# Patient Record
Sex: Male | Born: 1945 | Race: White | Hispanic: No | Marital: Married | Smoking: Never smoker
Health system: Southern US, Community
[De-identification: ages and names within clinical notes are randomized; demographics above are authoritative.]

## PROBLEM LIST (undated history)

## (undated) DIAGNOSIS — M1990 Unspecified osteoarthritis, unspecified site: Secondary | ICD-10-CM

## (undated) DIAGNOSIS — I499 Cardiac arrhythmia, unspecified: Secondary | ICD-10-CM

## (undated) DIAGNOSIS — F32A Depression, unspecified: Secondary | ICD-10-CM

## (undated) DIAGNOSIS — I493 Ventricular premature depolarization: Secondary | ICD-10-CM

## (undated) DIAGNOSIS — F329 Major depressive disorder, single episode, unspecified: Secondary | ICD-10-CM

## (undated) DIAGNOSIS — G510 Bell's palsy: Secondary | ICD-10-CM

## (undated) DIAGNOSIS — Z9889 Other specified postprocedural states: Secondary | ICD-10-CM

## (undated) DIAGNOSIS — R112 Nausea with vomiting, unspecified: Secondary | ICD-10-CM

## (undated) DIAGNOSIS — F419 Anxiety disorder, unspecified: Secondary | ICD-10-CM

## (undated) DIAGNOSIS — E1340 Other specified diabetes mellitus with diabetic neuropathy, unspecified: Secondary | ICD-10-CM

## (undated) DIAGNOSIS — J189 Pneumonia, unspecified organism: Secondary | ICD-10-CM

## (undated) DIAGNOSIS — E785 Hyperlipidemia, unspecified: Secondary | ICD-10-CM

## (undated) DIAGNOSIS — I10 Essential (primary) hypertension: Secondary | ICD-10-CM

## (undated) DIAGNOSIS — E119 Type 2 diabetes mellitus without complications: Secondary | ICD-10-CM

## (undated) DIAGNOSIS — Z87442 Personal history of urinary calculi: Secondary | ICD-10-CM

## (undated) DIAGNOSIS — R110 Nausea: Secondary | ICD-10-CM

## (undated) DIAGNOSIS — K219 Gastro-esophageal reflux disease without esophagitis: Secondary | ICD-10-CM

## (undated) DIAGNOSIS — I509 Heart failure, unspecified: Secondary | ICD-10-CM

## (undated) DIAGNOSIS — C801 Malignant (primary) neoplasm, unspecified: Secondary | ICD-10-CM

## (undated) HISTORY — DX: Ventricular premature depolarization: I49.3

## (undated) HISTORY — PX: EYE SURGERY: SHX253

## (undated) HISTORY — PX: CARDIAC CATHETERIZATION: SHX172

## (undated) HISTORY — PX: TONSILLECTOMY: SUR1361

## (undated) HISTORY — PX: BACK SURGERY: SHX140

## (undated) HISTORY — PX: CHOLECYSTECTOMY: SHX55

## (undated) HISTORY — DX: Hyperlipidemia, unspecified: E78.5

## (undated) HISTORY — PX: PROSTATECTOMY: SHX69

---

## 2002-12-19 ENCOUNTER — Encounter: Admission: RE | Admit: 2002-12-19 | Discharge: 2002-12-19 | Payer: Self-pay | Admitting: *Deleted

## 2002-12-19 ENCOUNTER — Encounter: Payer: Self-pay | Admitting: *Deleted

## 2004-03-14 ENCOUNTER — Observation Stay (HOSPITAL_COMMUNITY): Admission: RE | Admit: 2004-03-14 | Discharge: 2004-03-15 | Payer: Self-pay | Admitting: Urology

## 2006-10-16 ENCOUNTER — Encounter: Admission: RE | Admit: 2006-10-16 | Discharge: 2006-10-16 | Payer: Self-pay | Admitting: Unknown Physician Specialty

## 2006-10-21 ENCOUNTER — Encounter: Admission: RE | Admit: 2006-10-21 | Discharge: 2006-10-21 | Payer: Self-pay | Admitting: Unknown Physician Specialty

## 2006-11-25 ENCOUNTER — Encounter: Admission: RE | Admit: 2006-11-25 | Discharge: 2006-11-25 | Payer: Self-pay | Admitting: Neurological Surgery

## 2006-12-09 ENCOUNTER — Inpatient Hospital Stay (HOSPITAL_COMMUNITY): Admission: RE | Admit: 2006-12-09 | Discharge: 2006-12-14 | Payer: Self-pay | Admitting: Neurological Surgery

## 2007-01-04 ENCOUNTER — Encounter: Admission: RE | Admit: 2007-01-04 | Discharge: 2007-01-04 | Payer: Self-pay | Admitting: Neurological Surgery

## 2007-03-07 ENCOUNTER — Encounter: Admission: RE | Admit: 2007-03-07 | Discharge: 2007-03-07 | Payer: Self-pay | Admitting: Neurological Surgery

## 2007-05-31 ENCOUNTER — Encounter: Admission: RE | Admit: 2007-05-31 | Discharge: 2007-05-31 | Payer: Self-pay | Admitting: Neurological Surgery

## 2007-12-12 ENCOUNTER — Encounter: Admission: RE | Admit: 2007-12-12 | Discharge: 2007-12-12 | Payer: Self-pay | Admitting: Anesthesiology

## 2008-05-04 DIAGNOSIS — C801 Malignant (primary) neoplasm, unspecified: Secondary | ICD-10-CM

## 2008-05-04 HISTORY — DX: Malignant (primary) neoplasm, unspecified: C80.1

## 2008-11-08 IMAGING — CT CT L SPINE W/ CM
4 of 10 series · 12 of 33 positions shown, 14 images · IV contrast (omnipaque)
Comparison: MR lumbar spine, 10/16/06.

CLINICAL DATA: Severe back and right leg pain.  
LUMBAR MYELOGRAM:
TECHNIQUE: Following informed consent, sterile preparation of the back, and adequate local anesthesia, a lumbar puncture was performed using a 22 gauge spinal needle at L3-4 from a left paramedian approach.  Fluid was clear and colorless.  15 cc of Omnipaque 180 was instilled in the subarachnoid space.
TECHNIQUE: Multidetector CT imaging of the lumbar spine was performed after intrathecal injection of contrast.  Multiplanar CT image reconstructions were also generated.

[Series 2: l-spine helical · axial · 0.27mm/px · z∈[-4,+76]mm · 2 of 97 slices shown, 3 images]
[im 33/97  soft-tissue]
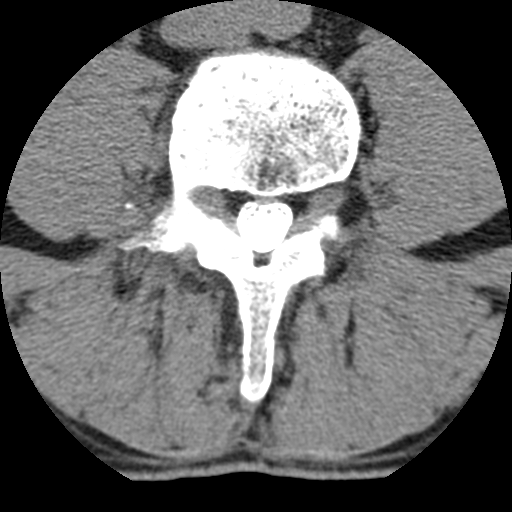
[im 33/97  bone]
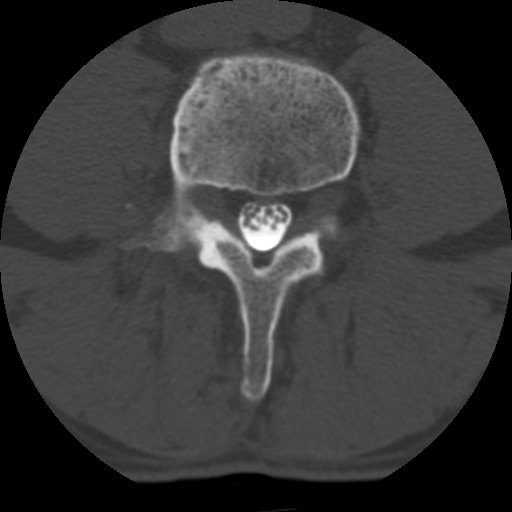
[im 65/97  bone]
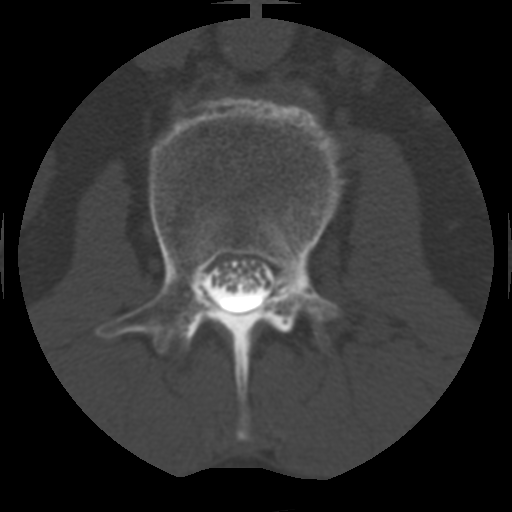

[Series 3: bone windows · axial · 0.27mm/px · z∈[-4,+76]mm · 2 of 97 slices shown]
[im 33/97  bone]
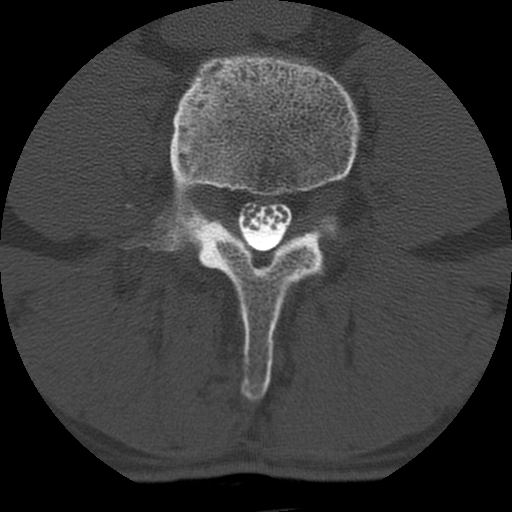
[im 65/97  bone]
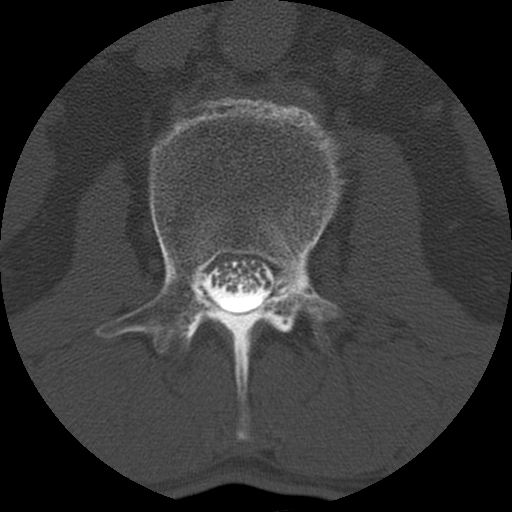

[Series 400: sagittal · sagittal · 0.48mm/px · 5 of 40 slices shown, 6 images]
[im 14/40  bone]
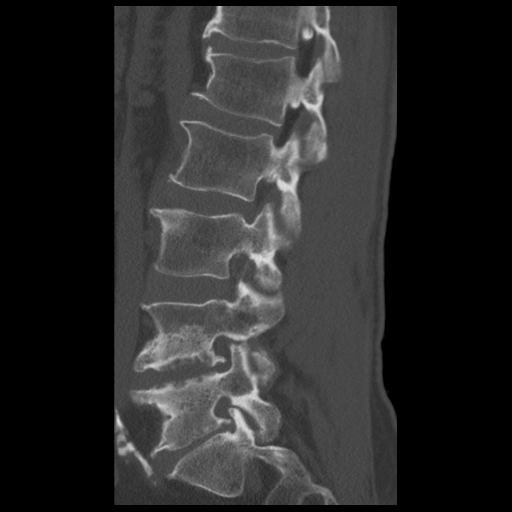
[im 17/40  bone]
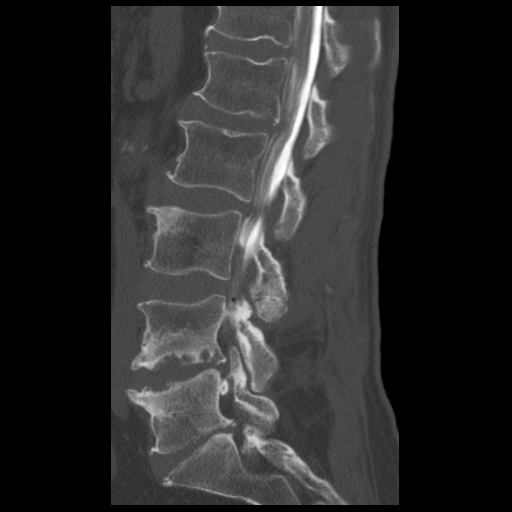
[im 20/40  soft-tissue]
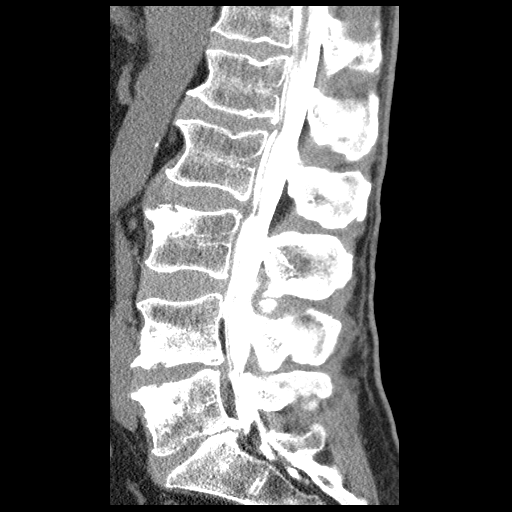
[im 20/40  bone]
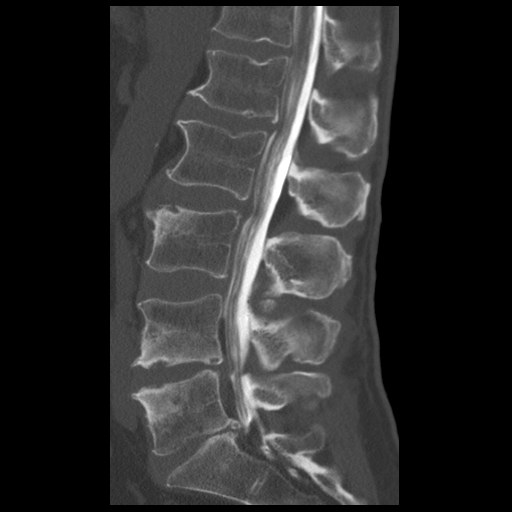
[im 23/40  bone]
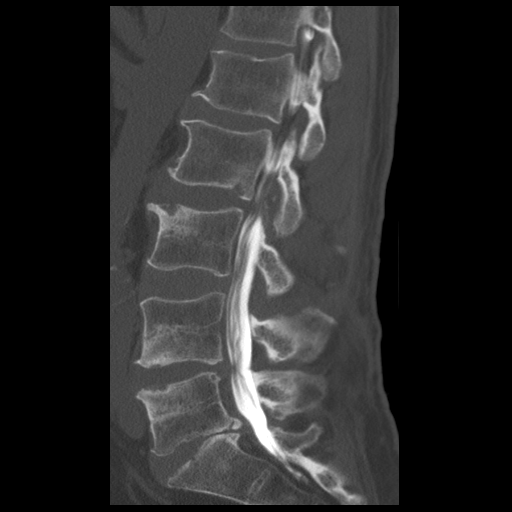
[im 27/40  bone]
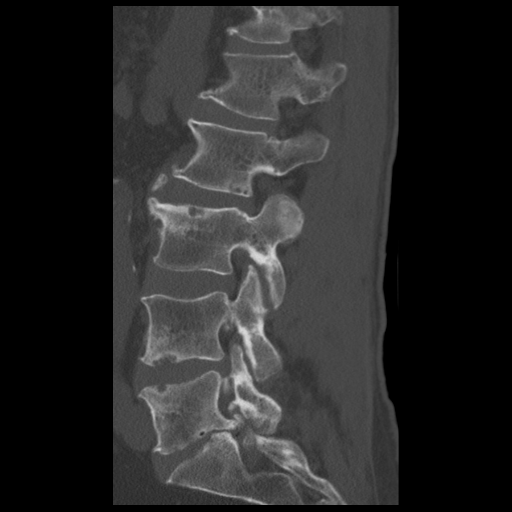

[Series 401: coronal · coronal · 0.48mm/px · 3 of 40 slices shown]
[im 8/40  bone]
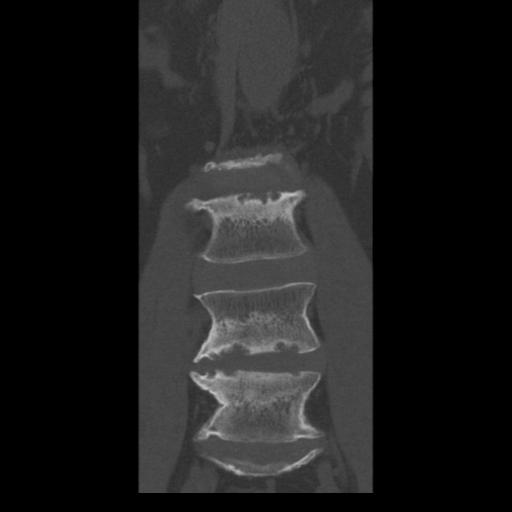
[im 16/40  bone]
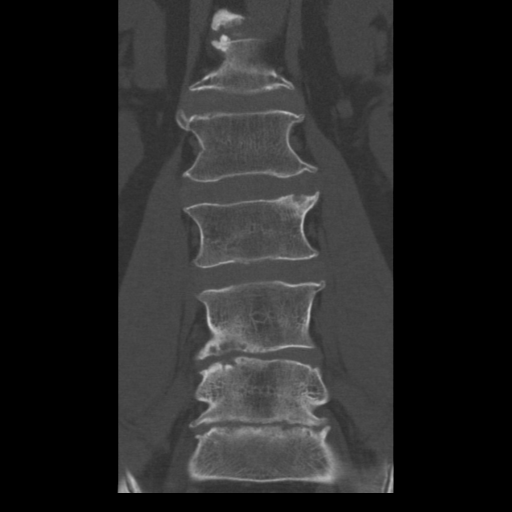
[im 24/40  bone]
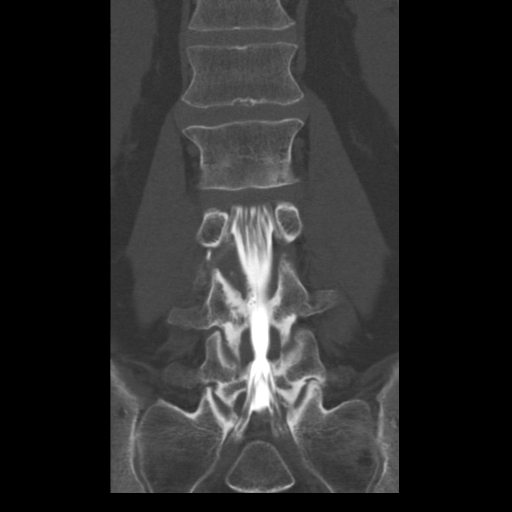

[12 of 33 positions shown; findings below may reference images not displayed]

FINDINGS: AP, lateral, and oblique views demonstrate significant waist-like narrowing at L4-5 compressing the thecal sac and both L5 nerve roots.  This is multifactorial in nature and related to central disk material as well as posterior element hypertrophy.  Disk space narrowing is present at L5-S1, but there is no clear cut S1 nerve root encroachment.  Moderate stenosis is present at L2-3 with bilateral L3 nerve root effacement, left greater than right.  A central disk protrusion is suspected at this level as well.  Flexion/extension show no significant abnormal movement.  Alignment is grossly anatomic.  Mild endplate reactive changes are seen above and below L2-3 and L4-5.
IMPRESSION: 1.  Severe stenosis L4-5.
2.  Mild to moderate stenosis L2-3.
POST MYELOGRAM CT SCAN OF THE LUMBAR SPINE:
FINDINGS: Individual disk spaces were examined as follows:
L1-2:  Mild bulge. 
L2-3:  Shallow central protrusion.  Mild stenosis.  Bilateral L3 nerve root encroachment, worse on the left.
L3-4:  Mild to moderate facet arthropathy.  No stenosis or disk protrusion.
L4-5:  Disk space narrowing with endplate reactive changes and osteophyte formation.  Central disk protrusion with posterior element hypertrophy.  Bilateral L5 nerve root encroachment due to lateral recess stenosis, right greater than left.  In addition, there is asymmetric foraminal narrowing on the right due to more severe loss of disk space on the right side.  There is a moderately large extraforaminal spur/disk complex on the right affecting the right L4 nerve root.
L5-S1:  Calcified central protrusion/osteophyte complex left greater than right with mild displacement of the left S1 nerve root.  Significant bony foraminal narrowing bilaterally affecting both L5 nerve roots equally.  Mild vascular calcification in the aorta is seen without aneurysmal dilatation.   No obvious paraspinous mass is noted. 
Good general agreement with prior MR.  The stenosis on CT and MR is considerably worse with the patient standing based on erect myelographic films.
IMPRESSION: 1.  Moderate stenosis at L4-5 multifactorial in nature due to central disk protrusion and posterior element hypertrophy; this stenosis is significantly worse with patient standing. 
2.  There is a large foraminal and extraforaminal spur at L4-5, right affecting the right L4 nerve root.
3.  Moderate disk space narrowing L5-S1 with bony overgrowth results primarily in biforaminal narrowing with L5 nerve root compression; calcified protrusion/osteophyte complex centrally results in mild left S1 nerve root displacement.  
4.  Mild stenosis L2-3, also multifactorial in nature.  Bilateral L3 nerve root effacement left greater than right.

## 2008-11-08 IMAGING — CR DG MYELOGRAM LUMBAR
4 series · 4 of 4 positions shown · IV contrast (omnipaque)
Comparison: MR lumbar spine, 10/16/06.

CLINICAL DATA: Severe back and right leg pain.  
LUMBAR MYELOGRAM:
TECHNIQUE: Following informed consent, sterile preparation of the back, and adequate local anesthesia, a lumbar puncture was performed using a 22 gauge spinal needle at L3-4 from a left paramedian approach.  Fluid was clear and colorless.  15 cc of Omnipaque 180 was instilled in the subarachnoid space.
TECHNIQUE: Multidetector CT imaging of the lumbar spine was performed after intrathecal injection of contrast.  Multiplanar CT image reconstructions were also generated.

[view not recorded (1 of 4)]
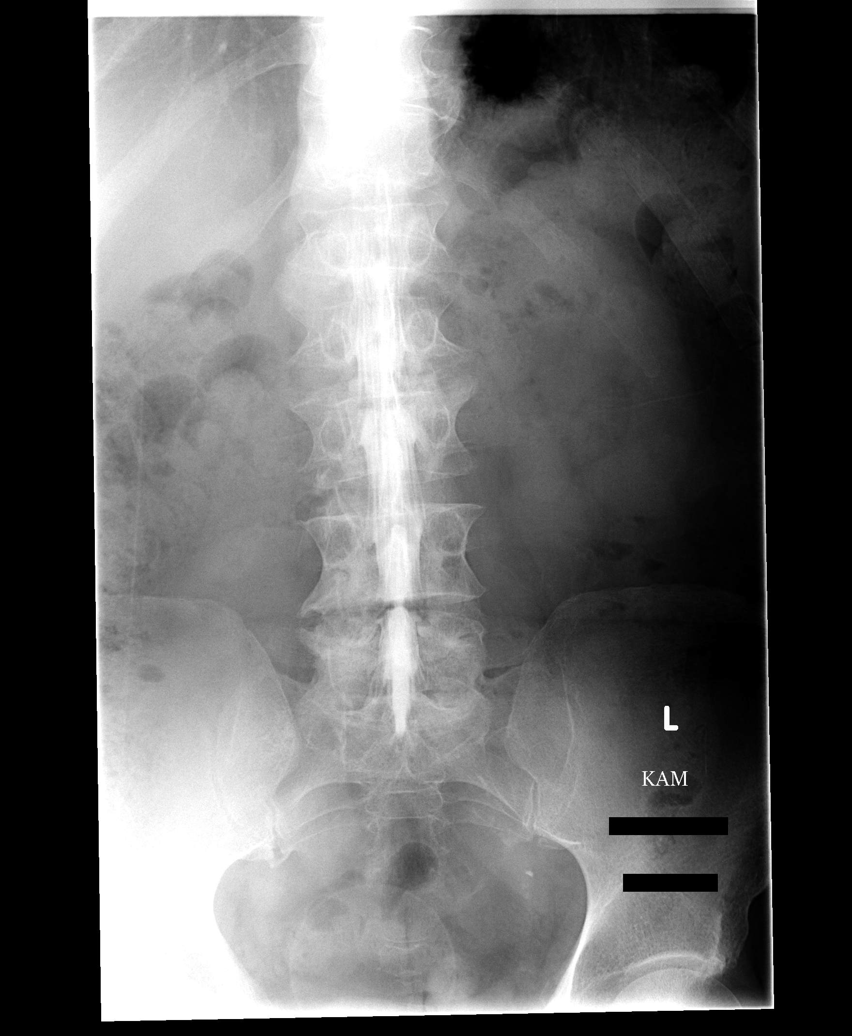

[view not recorded (2 of 4)]
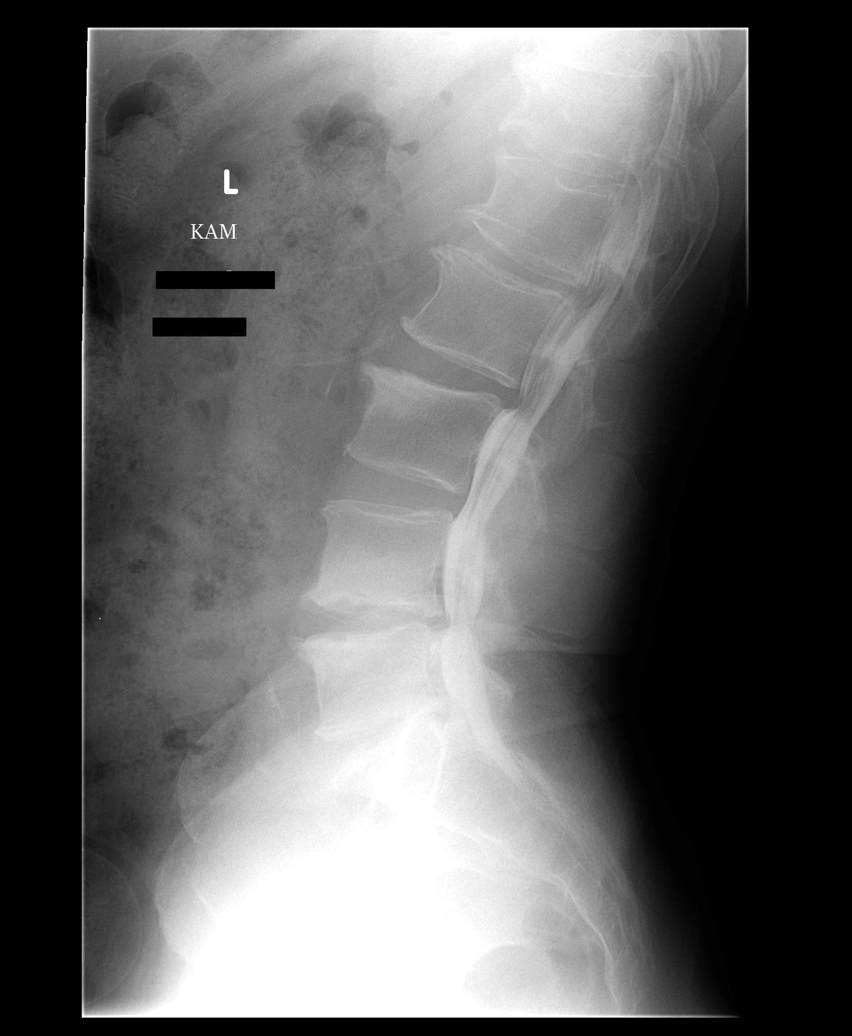

[view not recorded (3 of 4)]
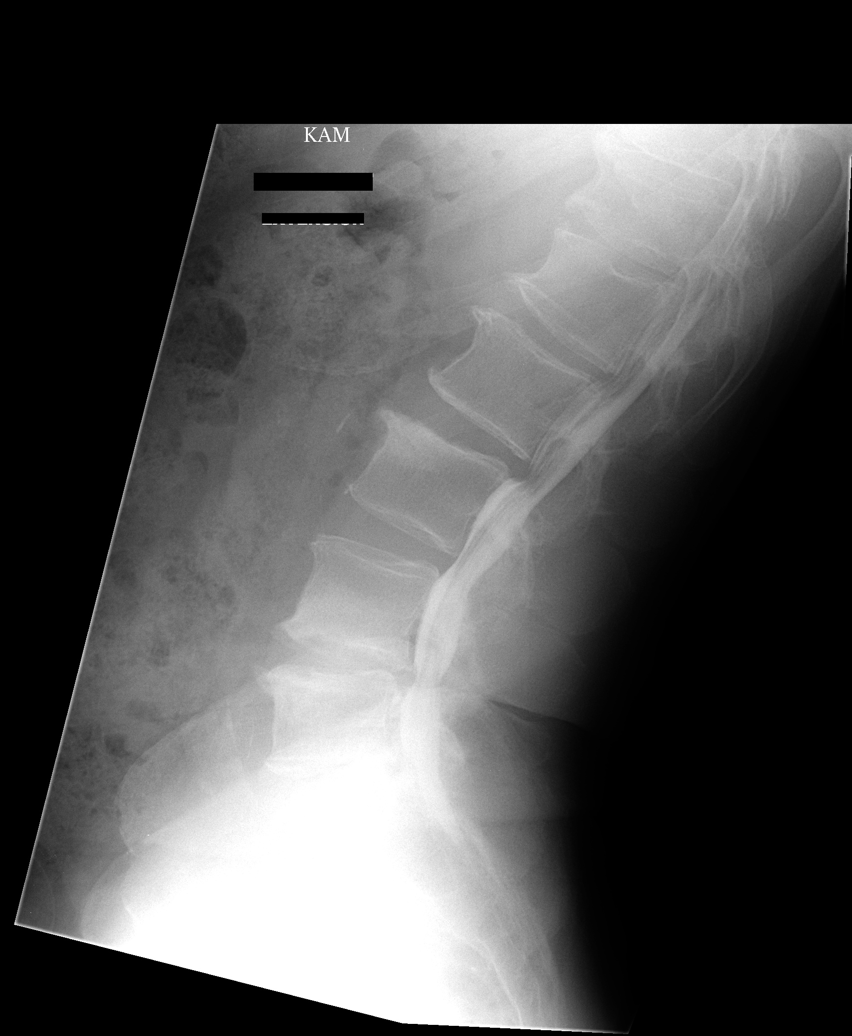

[view not recorded (4 of 4)]
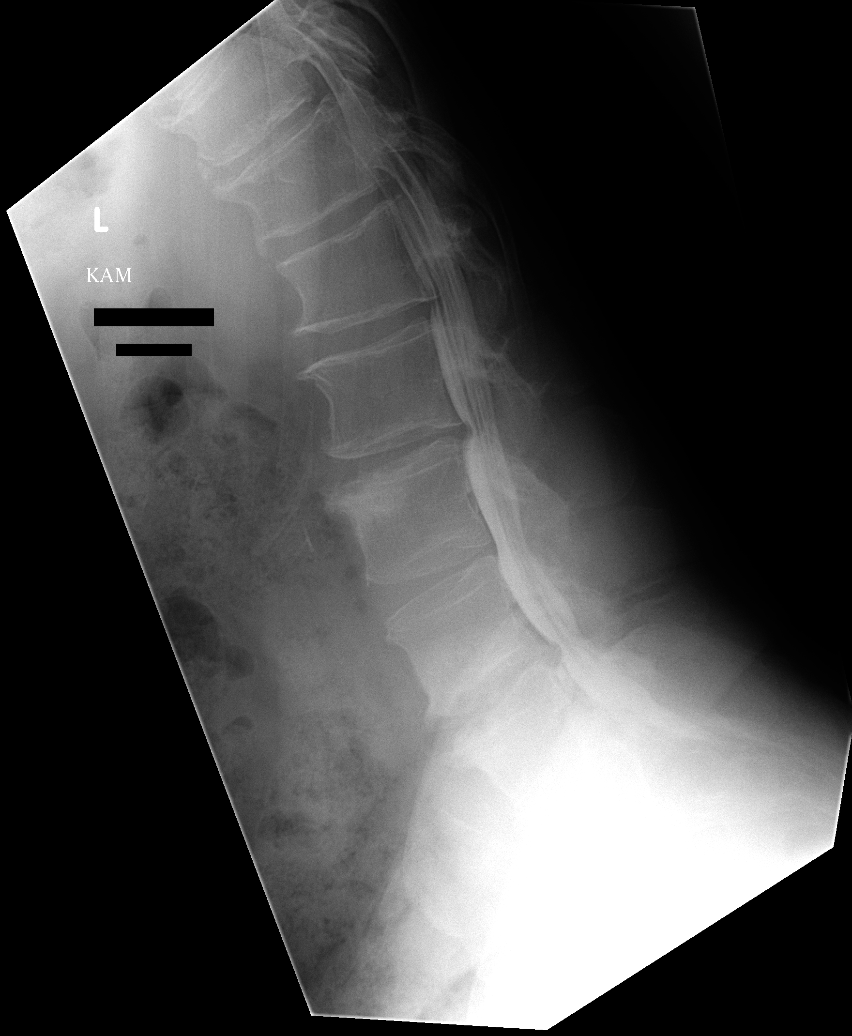

[4 of 4 positions shown; findings below may reference images not displayed]

FINDINGS: AP, lateral, and oblique views demonstrate significant waist-like narrowing at L4-5 compressing the thecal sac and both L5 nerve roots.  This is multifactorial in nature and related to central disk material as well as posterior element hypertrophy.  Disk space narrowing is present at L5-S1, but there is no clear cut S1 nerve root encroachment.  Moderate stenosis is present at L2-3 with bilateral L3 nerve root effacement, left greater than right.  A central disk protrusion is suspected at this level as well.  Flexion/extension show no significant abnormal movement.  Alignment is grossly anatomic.  Mild endplate reactive changes are seen above and below L2-3 and L4-5.
IMPRESSION: 1.  Severe stenosis L4-5.
2.  Mild to moderate stenosis L2-3.
POST MYELOGRAM CT SCAN OF THE LUMBAR SPINE:
FINDINGS: Individual disk spaces were examined as follows:
L1-2:  Mild bulge. 
L2-3:  Shallow central protrusion.  Mild stenosis.  Bilateral L3 nerve root encroachment, worse on the left.
L3-4:  Mild to moderate facet arthropathy.  No stenosis or disk protrusion.
L4-5:  Disk space narrowing with endplate reactive changes and osteophyte formation.  Central disk protrusion with posterior element hypertrophy.  Bilateral L5 nerve root encroachment due to lateral recess stenosis, right greater than left.  In addition, there is asymmetric foraminal narrowing on the right due to more severe loss of disk space on the right side.  There is a moderately large extraforaminal spur/disk complex on the right affecting the right L4 nerve root.
L5-S1:  Calcified central protrusion/osteophyte complex left greater than right with mild displacement of the left S1 nerve root.  Significant bony foraminal narrowing bilaterally affecting both L5 nerve roots equally.  Mild vascular calcification in the aorta is seen without aneurysmal dilatation.   No obvious paraspinous mass is noted. 
Good general agreement with prior MR.  The stenosis on CT and MR is considerably worse with the patient standing based on erect myelographic films.
IMPRESSION: 1.  Moderate stenosis at L4-5 multifactorial in nature due to central disk protrusion and posterior element hypertrophy; this stenosis is significantly worse with patient standing. 
2.  There is a large foraminal and extraforaminal spur at L4-5, right affecting the right L4 nerve root.
3.  Moderate disk space narrowing L5-S1 with bony overgrowth results primarily in biforaminal narrowing with L5 nerve root compression; calcified protrusion/osteophyte complex centrally results in mild left S1 nerve root displacement.  
4.  Mild stenosis L2-3, also multifactorial in nature.  Bilateral L3 nerve root effacement left greater than right.

## 2008-12-02 ENCOUNTER — Encounter: Admission: RE | Admit: 2008-12-02 | Discharge: 2008-12-02 | Payer: Self-pay | Admitting: Unknown Physician Specialty

## 2009-06-27 ENCOUNTER — Inpatient Hospital Stay (HOSPITAL_COMMUNITY): Admission: RE | Admit: 2009-06-27 | Discharge: 2009-06-28 | Payer: Self-pay | Admitting: Urology

## 2009-06-27 ENCOUNTER — Encounter (INDEPENDENT_AMBULATORY_CARE_PROVIDER_SITE_OTHER): Payer: Self-pay | Admitting: Urology

## 2009-07-02 ENCOUNTER — Ambulatory Visit (HOSPITAL_COMMUNITY): Admission: RE | Admit: 2009-07-02 | Discharge: 2009-07-02 | Payer: Self-pay | Admitting: Urology

## 2009-10-07 ENCOUNTER — Encounter: Admission: RE | Admit: 2009-10-07 | Discharge: 2009-10-07 | Payer: Self-pay | Admitting: Neurological Surgery

## 2010-04-24 ENCOUNTER — Encounter
Admission: RE | Admit: 2010-04-24 | Discharge: 2010-04-24 | Payer: Self-pay | Source: Home / Self Care | Attending: Unknown Physician Specialty | Admitting: Unknown Physician Specialty

## 2010-05-25 ENCOUNTER — Encounter: Payer: Self-pay | Admitting: Neurological Surgery

## 2010-07-23 LAB — CBC
HCT: 39.7 % (ref 39.0–52.0)
Hemoglobin: 13.1 g/dL (ref 13.0–17.0)
MCHC: 33.1 g/dL (ref 30.0–36.0)
MCV: 85.7 fL (ref 78.0–100.0)
WBC: 7.7 10*3/uL (ref 4.0–10.5)

## 2010-07-23 LAB — GLUCOSE, CAPILLARY
Glucose-Capillary: 134 mg/dL — ABNORMAL HIGH (ref 70–99)
Glucose-Capillary: 139 mg/dL — ABNORMAL HIGH (ref 70–99)
Glucose-Capillary: 174 mg/dL — ABNORMAL HIGH (ref 70–99)

## 2010-07-23 LAB — BASIC METABOLIC PANEL
Creatinine, Ser: 0.92 mg/dL (ref 0.4–1.5)
GFR calc Af Amer: >60 mL/min (ref >60)
Glucose, Bld: 136 mg/dL — ABNORMAL HIGH (ref 70–99)
Sodium: 139 mEq/L (ref 135–145)

## 2010-07-23 LAB — HEMOGLOBIN AND HEMATOCRIT, BLOOD
HCT: 36.7 % — ABNORMAL LOW (ref 39.0–52.0)
Hemoglobin: 12.1 g/dL — ABNORMAL LOW (ref 13.0–17.0)

## 2010-09-16 NOTE — Op Note (Signed)
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   PROCEDURE:  1. Decompressive lumbar hemilaminectomy, medial facetectomy and      foraminotomy L2-3 on the left, followed by sublaminar decompression      for central canal and right lateral recess decompression.  2. Lumbar redo decompressive laminectomy, hemifacetectomy and      foraminotomies at L4-5, L5-S1 for central canal and nerve root      decompression requiring more work than is usually required with the      typical posterior lumbar interbody fusion procedure.  3. Post lumbar interbody fusion L4-5, L5-S1 utilizing 12 mm PEEK      interbody cage packed local autograft and Actifuse and 12 mm      tangent interbody bone wedge at L4-5 and 10 mm PEEK interbody cage      packed with local autograft and Actifuse and a 10 mm tangent      interbody bone wedge at L5-S1.  4. Intertransverse arthrodesis L4-S1 bilaterally utilizing a mixture      of locally harvested morselized autologous bone graft and Actifuse.  5. Segmental fixation L4 to S1 bilaterally utilizing the radius      pedicle screw system.   SURGEON:  Dr. Marikay Alar.   ASSISTANT:  Dr. Aliene Beams.   ANESTHESIA:  General endotracheal.   COMPLICATIONS:  Small dural tear created with the D'Errico retractor  repaired primarily at L5-S1 on the left at the previous surgical site.   INDICATIONS FOR PROCEDURE:  Mr. Ollinger is a pleasant 65 year old  gentleman who presented with left calf atrophy.  He had undergone two  previous diskectomies at L5-S1 on the left side by another neurosurgeon  he had a CT myelogram and MRI which showed significant stenosis at L2-3  with severe stenosis at L4-5 and a broad-based calcified disk herniation  L5-S1 with severe degenerative disk disease with Modic endplate changes  at L4-5, L5-S1.  He had tried medical management for quite some time  without significant relief.  I recommended a posterior lumbar interbody  fusion L4-5, L5-S1 to address both his degenerative disk disease and  spinal stenosis and also recommended at the same time a decompressive  laminectomy at L2-3 for his central stenosis there.  He understood the  risks, benefits, expected outcome and wished to proceed.   DESCRIPTION OF PROCEDURE:  The patient was taken to operating  room and  after induction of adequate generalized endotracheal anesthesia he was  rolled into prone position on chest rolls and all pressure points were  padded.  Lumbar region was prepped with DuraPrep and draped in the usual  sterile fashion.  10 mL local anesthesia was injected and a dorsal  midline incision was made and carried down to the lumbosacral fascia.  The fascia was opened and the paraspinous musculature was taken down in  sub periosteal fashion to expose L4-5 and L5-S1.  Intraoperative  fluoroscopy confirmed my level at these levels.  I started the L2-3  level and used the Kerrison punch and the high-speed drill to perform a  hemilaminectomy, medial facetectomy and foraminotomy at L2-3 on the left  side.  The underlying yellow ligament was removed in a piecemeal  fashion.  It was quite compressive and quite thickened.  The underlying  dura relaxed as this was removed.  I then drilled up under the spinous   process and cut and used the Kerrison punch to bite up under this across  the midline into the lateral recess on the right side and decompress the  lateral recess.  I could see the opposite side of the dura and the L3  nerve root.  I palpated with a nerve hook to assure adequate  decompression by palpating both pedicles, had a nice decompression of  the central canal.  Therefore, went down to the L4-5, L5-S1 levels and  used the Leksell rongeur to remove the spinous processes and used a  combination of the Leksell rongeur and Kerrison punches to perform  complete laminectomies, hemifacetectomies and foraminotomies at L5, L5-  S1.  He had severe stenosis at L4-5 and quite overgrown ligament.  I  followed the L4, L5 and S1 nerve roots distally into the respective  foramina there was significant scar at L5-S1 on the left from the  previous surgery. This was detethered from the bone and the nerve root  was detethered from the underlying calcified disk herniation which  crossed the midline.  Once my decompression was completed I bipolared  the epidural venous vasculature which he continued to bleed profusely  from, we tried to control this with Gelfoam and packing throughout the  case.  We incised the disk space at L4-5 bilaterally and used sequential  distraction to distract up to 12 mm.  We then used a 12 mm rotating  cutter, round scraper and cutting chisel to prepare the endplates for  arthrodesis.  We used a 12 x 26 mm tangent interbody bone wedge on the  patient's left side and a 12 x 26 mm PEEK interbody cage packed local  autograft and Actifuse on the patient's right side.  The midline was  packed with Actifuse and local autograft.  Once our PLIF was complete at  L4-5.  We prepared the endplates at L5-S1 in the same fashion utilizing  the 10 mm rotating cutter, round scraper and cutting chisel.  This disk  was very degenerated and a thorough complete diskectomy was done.  There  is a  large osteophyte overlying the disk space at L5-S1 on the left.  This was removed.  Unfortunately using the D'Errico retractor here  created a small 2 or 3 mm unintended durotomy. The underlying arachnoid  was visualized but there was no leakage of CSF.  I still used a 6-0  Prolene and I repaired this primarily.  Once our PLIF was completed at  both levels, we localized the pedicle  screw entry zones by utilizing  surface landmarks and lateral fluoroscopy.  We probed each pedicle,  tapped each pedicle with a 6.25 tap and then placed 6.75 x 45 mm pedicle  screws in the L4 and L5 pedicles bilaterally and 6.75 x 35 mm pedicle  screws into the sacrum bilaterally.  We then decorticated the transverse  processes and placed a mixture of Actifuse and local autograft out over  these to perform intertransverse arthrodesis L4-S1.  We then irrigated  with saline solution containing bacitracin.  We placed 70 mm lordotic  rods into multiaxial screw heads of the pedicle screws and locked these  into position with a locking caps and the anti torque device after  achieving compression on our grafts.  We then inspected our nerve roots  once again to assure adequate decompression.  We lined the dura with  Gelfoam.  We used Tisseel fibrin glue on the left side where the small  unintended durotomy had been.  We then placed a medium Hemovac drain  through a separate stab incision and then closed the muscle and fascia  with 0-0 Vicryl, closed the subcutaneous and subcuticular tissue with 2-  0 and 3-0 Vicryl, closed the skin with Benzoin and Steri-Strips.  The  drapes removed.  A sterile dressing was applied.  The patient was  awakened from general anesthesia and transferred recovery room in stable  condition.  At the end of the procedure all sponge, needle and sponge  counts were correct.      Tia Alert, MD  Electronically Signed     DSJ/MEDQ  D:  12/09/2006  T:  12/10/2006  Job:  [id number]

## 2010-09-19 NOTE — Discharge Summary (Signed)
NAMEMANDELL, PANGBORN NO.:  [id number]   MEDICAL RECORD NO.:  [id number]          PATIENT TYPE:  INP   LOCATION:  5120                         FACILITY:  [facility]   PHYSICIAN:  Tia Alert, MD     DATE OF BIRTH:  [birth year]   DATE OF ADMISSION:  12/09/2006  DATE OF DISCHARGE:  12/14/2006                               DISCHARGE SUMMARY   ADMISSION DIAGNOSIS:  Lumbar spinal stenosis L2-3 with lumbar spinal  stenosis L4-5, L5-S1 with back and bilateral leg pain.   PROCEDURE:  Decompressive lumbar laminectomy at L2-3 followed by  decompressive laminectomy instrument effusion L4-5, L5-S1.   BRIEF HISTORY OF PRESENT ILLNESS:  Mr. Briner is a pleasant 65 year old  gentleman who presented with left calf atrophy.  He had undergone 2  previous diskectomies L5-S1 on the left side by another neurosurgeon and  he had a CT myelogram and MRI which showed significant stenosis at L2-3  with severe stenosis at L4-5 and a broad-based calcified disk herniation  at L5-S1 with severe degenerative disk disease at both levels.  He tried  medical management __________  without significant relief.  I  recommended a posterior lumbar interbody fusion at L4-5, L5-S1 to  address both the spinal stenosis and the degenerative disk disease.  I  also recommended decompressive laminectomy at L2-3 where he had  significant stenosis.  He understood the risks, benefits, and suspected  outcome and wished to proceed.   HOSPITAL COURSE:  The patient was admitted on December 09, 2006, taken to  the operating room where he underwent a decompressive laminectomy at L2-  3 followed by a posterior lumbar interbody fusion at L4-5 and L5-S1.  The patient tolerated the procedure well and was taken to the recovery  room and then to the floor in stable condition.  For details of the  operative procedure please see the dictated operative noted.  The  patient's hospital courses were obtained, there were no  complications.  He spent the first evening at bedrest with a Foley catheter in place  with a low dose Dilaudid PCA for pain control.  The following morning  his Foley catheter was discontinued.  He was allowed out of bed and he  began to ambulate with physical therapy and occupational therapy.  He  remained on a Dilaudid PCA for one more day then this was discontinued.  He was placed on valium for muscle spasms and Percocet for pain.  He  continued to ambulate with the assistance of a walker.  He made slow  progress with physical therapy and occupational therapy and was  discharged to home in stable condition on December 14, 2006, with plans to  followup with Dr. Yetta Barre in 2 weeks.  He had all of his discharge  questions answered to his satisfaction, demonstrated an understanding of  all discharge instructions.  He was to call the office if any unusual  redness, tenderness, swelling, or drainage from his wound or temperature  of 101.5.  His followup is in 2 weeks.   FINAL DIAGNOSIS:  Posterior lumbar interbody fusion L4-5, L5-S1.  Tia Alert, MD  Electronically Signed     DSJ/MEDQ  D:  01/28/2007  T:  01/28/2007  Job:  [id number]

## 2010-09-19 NOTE — Op Note (Signed)
NAMESHULEM, MADER                ACCOUNT NO.:  [id number]   MEDICAL RECORD NO.:  [id number]          PATIENT TYPE:  OBV   LOCATION:  0259                         [facility]ACILITY:  [facility]   PHYSICIAN:  Lindaann Slough, M.D.  DATE OF BIRTH:  1945-07-07   DATE OF PROCEDURE:  03/14/2004  DATE OF DISCHARGE:                                 OPERATIVE REPORT   PREOPERATIVE DIAGNOSES:  Peyronie's disease and erectile dysfunction.   POSTOPERATIVE DIAGNOSES:  Peyronie's disease and erectile dysfunction.   PROCEDURE:  Insertion of inflatable penile prosthesis.   SURGEON:  Dr. Brunilda Payor.   ASSISTANT:  Dr. Patsi Sears.   ANESTHESIA:  General.   INDICATIONS:  The patient is a 65 year old male who had been complaining of  a dorsal curvature of his penis for the past year to year and a half. He  states that it started after he had a TUNA procedure, and when he has an  erection, it bends almost 90 degrees, and he has not been able to have  intercourse. The patient is scheduled today for insertion of inflatable  penile prosthesis.   Under general anesthesia, the patient was prepped and draped and placed in  the supine position. A __________ infrapubic incision was made. The incision  was carried down to the subcutaneous tissues. The __________ were identified  and free from the surrounding tissues. Two stay sutures were placed on each  corpus, and a corporotomy was done on the left side first. Then, the corpus  was dilated with Hegar dilators without difficulty up to #11. Then, the same  procedure was done on the right side. The length of each corpus was found to  be 19 cm. Then, the rectus fascia was incised, the rectus muscles were split  in the midline, and a pouch was created under the rectus muscle. A 65-cc  reservoir was placed under the rectus muscle and tubing was brought out  through the incision. Then, the rectus fascia was approximated with 2-0  Vicryl. Then, an 18-cm inflatable prosthesis with  a 1-cm rear tip extender  was placed on each corpus with the Furlow inserter. The cylinders were in  good position in each corpus, and then the cylinders were inflated. There  was still a 45-degree ventral curvature of the penis. Modeling of the  erectile penis was done by bending the penis dorsally, which straightened  the penis. The corporotomies were then closed with 2-0 Vicryl with preplaced  sutures. Then, a scrotal pouch was created under the dartos, and the  inflate/deflate pump was placed in the scrotum in its most dependent  position. The reservoir was then inflated with 60 cc of normal saline, and  the cylinders were then deflated. The tubing of the reservoir was then  approximated to the tubing of the inflate/deflate pump with straight  connector. The cylinders were again inflated, and the penis remained in  straight position. The wound was then irrigated with bug juice. Then the  incision was closed in 2 layers with 3-0 Vicryl and 4-0 Monocryl on the skin  using subcuticular sutures. The cylinders were then inflated,  and the penis  was placed on the patient's abdomen and secured to the abdomen with Hypafix  tape. The Foley catheter was then connected to a drainage bag.   Needle and instrument count were correct on 2 occasions. Estimated blood  loss minimal.   The patient tolerated the procedure well and left the OR in satisfactory  condition to post anesthesia care unit.      MN/MEDQ  D:  03/14/2004  T:  03/16/2004  Job:  [id number]

## 2010-09-19 NOTE — Op Note (Signed)
NAMECASSIAN, Joseph Bird                ACCOUNT NO.:  [id number]   MEDICAL RECORD NO.:  [id number]          PATIENT TYPE:  OBV   LOCATION:  0259                         [facility]ACILITY:  [facility]   PHYSICIAN:  Lindaann Slough, M.D.  DATE OF BIRTH:  1946-04-15   DATE OF PROCEDURE:  DATE OF DISCHARGE:                                 OPERATIVE REPORT   Audio too short to transcribe (less than 5 seconds)      MN/MEDQ  D:  03/14/2004  T:  03/14/2004  Job:  [id number]

## 2010-10-14 ENCOUNTER — Other Ambulatory Visit: Payer: Self-pay | Admitting: Internal Medicine

## 2010-10-14 DIAGNOSIS — R160 Hepatomegaly, not elsewhere classified: Secondary | ICD-10-CM

## 2010-10-21 ENCOUNTER — Ambulatory Visit
Admission: RE | Admit: 2010-10-21 | Discharge: 2010-10-21 | Disposition: A | Discharge status: Home / Self Care | Payer: Medicare Other | Source: Ambulatory Visit | Attending: Internal Medicine | Admitting: Internal Medicine

## 2010-10-21 DIAGNOSIS — R160 Hepatomegaly, not elsewhere classified: Secondary | ICD-10-CM

## 2010-10-21 MED ORDER — GADOBENATE DIMEGLUMINE 529 MG/ML IV SOLN
18.0000 mL | Freq: Once | INTRAVENOUS | Status: AC | PRN
  Administered 2010-10-21: 18 mL via INTRAVENOUS

## 2011-02-16 LAB — CBC
HCT: 25.9 — ABNORMAL LOW
HCT: 27.8 — ABNORMAL LOW
HCT: 28.2 — ABNORMAL LOW
HCT: 31.3 — ABNORMAL LOW
HCT: 45.7
Hemoglobin: 10.6 — ABNORMAL LOW
Hemoglobin: 8.8 — ABNORMAL LOW
Hemoglobin: 9.4 — ABNORMAL LOW
MCHC: 33.7
MCHC: 33.9
MCHC: 34.6
MCV: 92.9
MCV: 93.1
MCV: 93.3
MCV: 93.4
Platelets: 165
Platelets: 173
Platelets: 199
Platelets: 252
Platelets: 253
RBC: 2.77 — ABNORMAL LOW
RBC: 2.78 — ABNORMAL LOW
RBC: 3.05 — ABNORMAL LOW
RDW: 12.4
RDW: 12.4
RDW: 12.5
RDW: 12.5
RDW: 12.6
WBC: 16.8 — ABNORMAL HIGH
WBC: 8.4
WBC: 9.8

## 2011-02-16 LAB — DIFFERENTIAL
Eosinophils Absolute: 0.1
Eosinophils Relative: 1
Lymphs Abs: 2.5

## 2011-02-16 LAB — POCT I-STAT 4, (NA,K, GLUC, HGB,HCT)
HCT: 33 — ABNORMAL LOW
Operator id: 160261
Sodium: 135

## 2011-02-16 LAB — TYPE AND SCREEN: Antibody Screen: NEGATIVE

## 2011-02-16 LAB — PROTIME-INR: Prothrombin Time: 13

## 2011-02-16 LAB — BASIC METABOLIC PANEL
BUN: 19
Chloride: 103
Glucose, Bld: 120 — ABNORMAL HIGH
Potassium: 5

## 2011-02-16 LAB — ABO/RH: ABO/RH(D): B POS

## 2011-11-04 ENCOUNTER — Other Ambulatory Visit: Payer: Self-pay | Admitting: Neurological Surgery

## 2011-11-04 DIAGNOSIS — IMO0002 Reserved for concepts with insufficient information to code with codable children: Secondary | ICD-10-CM

## 2011-11-17 ENCOUNTER — Other Ambulatory Visit: Payer: Medicare Other

## 2011-11-23 ENCOUNTER — Inpatient Hospital Stay: Admission: RE | Admit: 2011-11-23 | Payer: Medicare Other | Source: Ambulatory Visit

## 2011-12-01 ENCOUNTER — Ambulatory Visit
Admission: RE | Admit: 2011-12-01 | Discharge: 2011-12-01 | Disposition: A | Discharge status: Home / Self Care | Payer: Medicare Other | Source: Ambulatory Visit | Attending: Neurological Surgery | Admitting: Neurological Surgery

## 2011-12-01 DIAGNOSIS — IMO0002 Reserved for concepts with insufficient information to code with codable children: Secondary | ICD-10-CM

## 2013-01-10 ENCOUNTER — Other Ambulatory Visit: Payer: Self-pay | Admitting: Neurological Surgery

## 2013-01-10 DIAGNOSIS — M549 Dorsalgia, unspecified: Secondary | ICD-10-CM

## 2013-01-13 ENCOUNTER — Ambulatory Visit
Admission: RE | Admit: 2013-01-13 | Discharge: 2013-01-13 | Disposition: A | Discharge status: Home / Self Care | Payer: Medicare Other | Source: Ambulatory Visit | Attending: Neurological Surgery | Admitting: Neurological Surgery

## 2013-01-13 VITALS — BP 147/89 | HR 78

## 2013-01-13 DIAGNOSIS — M549 Dorsalgia, unspecified: Secondary | ICD-10-CM

## 2013-01-13 MED ORDER — IOHEXOL 180 MG/ML  SOLN
15.0000 mL | Freq: Once | INTRAMUSCULAR | Status: AC | PRN
  Administered 2013-01-13: 15 mL via INTRATHECAL

## 2013-01-13 MED ORDER — DIAZEPAM 5 MG PO TABS
5.0000 mg | ORAL_TABLET | Freq: Once | ORAL | Status: AC
  Administered 2013-01-13: 5 mg via ORAL

## 2013-01-16 ENCOUNTER — Other Ambulatory Visit: Payer: Self-pay | Admitting: Neurological Surgery

## 2013-01-27 ENCOUNTER — Encounter (HOSPITAL_COMMUNITY)
Admission: RE | Admit: 2013-01-27 | Discharge: 2013-01-27 | Disposition: A | Discharge status: Home / Self Care | Payer: Medicare Other | Source: Ambulatory Visit | Attending: Anesthesiology | Admitting: Anesthesiology

## 2013-01-27 ENCOUNTER — Encounter (HOSPITAL_COMMUNITY): Payer: Self-pay

## 2013-01-27 ENCOUNTER — Encounter (HOSPITAL_COMMUNITY)
Admission: RE | Admit: 2013-01-27 | Discharge: 2013-01-27 | Disposition: A | Discharge status: Home / Self Care | Payer: Medicare Other | Source: Ambulatory Visit | Attending: Neurological Surgery | Admitting: Neurological Surgery

## 2013-01-27 DIAGNOSIS — Z01812 Encounter for preprocedural laboratory examination: Secondary | ICD-10-CM | POA: Insufficient documentation

## 2013-01-27 DIAGNOSIS — Z01818 Encounter for other preprocedural examination: Secondary | ICD-10-CM | POA: Insufficient documentation

## 2013-01-27 DIAGNOSIS — Z01810 Encounter for preprocedural cardiovascular examination: Secondary | ICD-10-CM | POA: Insufficient documentation

## 2013-01-27 HISTORY — DX: Nausea: R11.0

## 2013-01-27 HISTORY — DX: Gastro-esophageal reflux disease without esophagitis: K21.9

## 2013-01-27 HISTORY — DX: Essential (primary) hypertension: I10

## 2013-01-27 HISTORY — DX: Anxiety disorder, unspecified: F41.9

## 2013-01-27 HISTORY — DX: Pneumonia, unspecified organism: J18.9

## 2013-01-27 HISTORY — DX: Other specified diabetes mellitus with diabetic neuropathy, unspecified: E13.40

## 2013-01-27 HISTORY — DX: Nausea with vomiting, unspecified: Z98.890

## 2013-01-27 HISTORY — DX: Type 2 diabetes mellitus without complications: E11.9

## 2013-01-27 HISTORY — DX: Malignant (primary) neoplasm, unspecified: C80.1

## 2013-01-27 HISTORY — DX: Nausea with vomiting, unspecified: R11.2

## 2013-01-27 LAB — BASIC METABOLIC PANEL
BUN: 13 mg/dL (ref 6–23)
Calcium: 10 mg/dL (ref 8.4–10.5)
GFR calc Af Amer: >90 mL/min (ref >90)
GFR calc non Af Amer: 86 mL/min — ABNORMAL LOW (ref >90)
Glucose, Bld: 90 mg/dL (ref 70–99)
Potassium: 3.9 mEq/L (ref 3.5–5.1)
Sodium: 141 mEq/L (ref 135–145)

## 2013-01-27 LAB — CBC
Hemoglobin: 17.1 g/dL — ABNORMAL HIGH (ref 13.0–17.0)
MCH: 31.4 pg (ref 26.0–34.0)
MCHC: 35.1 g/dL (ref 30.0–36.0)
Platelets: 223 10*3/uL (ref 150–400)
RBC: 5.45 MIL/uL (ref 4.22–5.81)
RDW: 13 % (ref 11.5–15.5)

## 2013-01-27 LAB — SURGICAL PCR SCREEN
MRSA, PCR: NEGATIVE
Staphylococcus aureus: NEGATIVE

## 2013-01-27 LAB — TYPE AND SCREEN: Antibody Screen: NEGATIVE

## 2013-01-27 NOTE — Progress Notes (Signed)
Primary Physician - Dr. Judith Blonder - [facility] in [place] Does not have a cardiologist. No prior cardiac testing

## 2013-01-27 NOTE — Pre-Procedure Instructions (Signed)
Joseph Bird  01/27/2013   Your procedure is scheduled on:  Wednesday, October 1st  Report to Main Entrance "A" at 0915 AM.  Call this number if you have problems the morning of surgery: [phone]   Remember:   Do not eat food or drink liquids after midnight.   Take these medicines the morning of surgery with A SIP OF WATER:    Do not wear jewelry, make-up or nail polish.  Do not wear lotions, powders, or perfumes. You may wear deodorant.  Do not shave 48 hours prior to surgery. Men may shave face and neck.  Do not bring valuables to the hospital.  [facility] is not responsible  for any belongings or valuables.               Contacts, dentures or bridgework may not be worn into surgery.  Leave suitcase in the car. After surgery it may be brought to your room.  For patients admitted to the hospital, discharge time is determined by your treatment team.   Special Instructions: Shower using CHG 2 nights before surgery and the night before surgery.  If you shower the day of surgery use CHG.  Use special wash - you have one bottle of CHG for all showers.  You should use approximately 1/3 of the bottle for each shower.   Please read over the following fact sheets that you were given: Pain Booklet, Coughing and Deep Breathing, Blood Transfusion Information, MRSA Information and Surgical Site Infection Prevention

## 2013-01-30 ENCOUNTER — Encounter (HOSPITAL_COMMUNITY): Payer: Self-pay | Admitting: Vascular Surgery

## 2013-01-30 NOTE — Progress Notes (Addendum)
Anesthesia Chart Review:  Patient is a 67 year old male scheduled for L2-3, L3-4 maximum access posterior lumbar interbody fusion, removal of hardware L4-S1 on 02/01/13 by Dr. Yetta Barre. PCP is Dr. Donnel Saxon at [facility] in [place].  History includes non-smoker, post-operative N/V, chronic nausea, HTN, DM2 with peripheral neuropathy, anxiety, GERD, prostate cancer s/p prostatectomy '11, tonsillectomy, back surgery X 3, cholecystectomy.   Meds: Xanax XR, Oscal with D, Vitamin D, Neurontin, Glucotrol XL, Dilaudid, Prevacid, lisinopril, metformin, morphine, Zofran, pravastatin.  EKG on 01/27/13 showed SR with frequent PVCs, LAD.  PVC's are new since 06/24/09.  Per PAT RN note, he denies previous cardiac testing.  Preoperative CXR and labs noted.  I reviewed above with anesthesiologist Dr. Randa Evens who agrees with additional input from Dr. Ardelle Park.  I called and spoke with Dr. Ardelle Park prior to noon today about plans for surgery and multiple PVC's on recent EKG.  He requested EKGs be faxed (done with confirmation).  He will review and determine if he feels additional preoperative testing is indicated priori to surgery.  I'll follow-up once additional recommendations are known.  Erie Noe at Dr. Yetta Barre' office updated.  Velna Ochs [facility] Stay [facility]/Anesthesiology Phone [phone] 01/30/2013 5:09 PM  Addendum: 01/31/2013 2:10 PM I just received a phone call from [facility] at Dr. Stefanie Libel office.  He has recommended that patient have a stress test prior to surgery.  This has been scheduled for 02/06/13.  I have notified Erie Noe at Dr. Yetta Barre' office regarding need to postpone surgery until he is cleared pending stress test results.  She will follow-up with Dr. Yetta Barre and the patient.  She was also given Dr. Stefanie Libel contact information if needed.

## 2013-01-31 MED ORDER — CEFAZOLIN SODIUM-DEXTROSE 2-3 GM-% IV SOLR: 2.0000 g | INTRAVENOUS | Status: DC

## 2013-02-01 ENCOUNTER — Encounter (HOSPITAL_COMMUNITY): Admission: RE | Payer: Self-pay | Source: Ambulatory Visit

## 2013-02-01 ENCOUNTER — Inpatient Hospital Stay (HOSPITAL_COMMUNITY): Admission: RE | Admit: 2013-02-01 | Payer: Medicare Other | Source: Ambulatory Visit | Admitting: Neurological Surgery

## 2013-02-01 SURGERY — FOR MAXIMUM ACCESS (MAS) POSTERIOR LUMBAR INTERBODY FUSION (PLIF) 2 LEVEL
Anesthesia: General | Site: Back

## 2013-02-14 ENCOUNTER — Other Ambulatory Visit: Payer: Self-pay | Admitting: Neurological Surgery

## 2013-03-02 ENCOUNTER — Encounter (HOSPITAL_COMMUNITY): Payer: Self-pay | Admitting: Pharmacy Technician

## 2013-03-03 ENCOUNTER — Encounter (HOSPITAL_COMMUNITY)
Admission: RE | Admit: 2013-03-03 | Discharge: 2013-03-03 | Disposition: A | Discharge status: Home / Self Care | Payer: Medicare Other | Source: Ambulatory Visit | Attending: Neurological Surgery | Admitting: Neurological Surgery

## 2013-03-03 ENCOUNTER — Encounter (HOSPITAL_COMMUNITY): Payer: Self-pay

## 2013-03-03 DIAGNOSIS — Z01818 Encounter for other preprocedural examination: Secondary | ICD-10-CM | POA: Insufficient documentation

## 2013-03-03 DIAGNOSIS — Z01812 Encounter for preprocedural laboratory examination: Secondary | ICD-10-CM | POA: Insufficient documentation

## 2013-03-03 LAB — CBC WITH DIFFERENTIAL/PLATELET
HCT: 45.8 % (ref 39.0–52.0)
Hemoglobin: 15.8 g/dL (ref 13.0–17.0)
Lymphocytes Relative: 34 % (ref 12–46)
Lymphs Abs: 2.7 10*3/uL (ref 0.7–4.0)
Monocytes Relative: 10 % (ref 3–12)
Neutro Abs: 4.3 10*3/uL (ref 1.7–7.7)
Neutrophils Relative %: 54 % (ref 43–77)
RBC: 5.02 MIL/uL (ref 4.22–5.81)

## 2013-03-03 LAB — BASIC METABOLIC PANEL
Calcium: 9.6 mg/dL (ref 8.4–10.5)
Chloride: 100 mEq/L (ref 96–112)
GFR calc Af Amer: >90 mL/min (ref >90)
GFR calc non Af Amer: 83 mL/min — ABNORMAL LOW (ref >90)
Potassium: 4.2 mEq/L (ref 3.5–5.1)
Sodium: 140 mEq/L (ref 135–145)

## 2013-03-03 LAB — TYPE AND SCREEN: ABO/RH(D): B POS

## 2013-03-03 LAB — PROTIME-INR
INR: 0.84 (ref 0.00–1.49)
Prothrombin Time: 11.4 seconds — ABNORMAL LOW (ref 11.6–15.2)

## 2013-03-03 LAB — SURGICAL PCR SCREEN: MRSA, PCR: NEGATIVE

## 2013-03-03 NOTE — Pre-Procedure Instructions (Signed)
Joseph Bird  03/03/2013    Your procedure is scheduled on: Thursday March 09, 2013 0730 AM  Report to [facility] Short Stay Main Entrance"A" at 0530 AM.  Call this number if you have problems the morning of surgery: [phone]   Remember:   Do not eat food or drink liquids after midnight.   Take these medicines the morning of surgery with A SIP OF WATER: Xanax, Gabapentin, Prevacid,Hydromorphone if needed for pain, and Zofran.   Stop all Aspirin, Herbal medication, Nsaids today 03/03/2013  Do not wear jewelry.  Do not wear lotions, powders, or perfumes. You may wear deodorant.   Men may shave face and neck.  Do not bring valuables to the hospital.  [facility] is not responsible   for any belongings or valuables.               Contacts, dentures or bridgework may not be worn into surgery.  Leave suitcase in the car. After surgery it may be brought to your room.  For patients admitted to the hospital, discharge time is determined by your treatment team.               Patients discharged the day of surgery will not be allowed to drive home.    Special Instructions: Incentive Spirometry - Practice and bring it with you on the day of surgery. Shower using CHG 2 nights before surgery and the night before surgery.  If you shower the day of surgery use CHG.  Use special wash - you have one bottle of CHG for all showers.  You should use approximately 1/3 of the bottle for each shower.   Please read over the following fact sheets that you were given: Pain Booklet, Coughing and Deep Breathing, Blood Transfusion Information, MRSA Information and Surgical Site Infection Prevention

## 2013-03-06 NOTE — Progress Notes (Signed)
Anesthesia follow-up:  See my note from 01/27/13.  Patient was referred for a preoperative nuclear stress test by his PCP Dr. Ardelle Park following an EKG showing multiple PVC's.  Stress test was done on 02/06/13 at [facility] and read by Dr. Tomie China.  Results showed no evidence of ischemia, EF 40%.  He is on b-blocker and ACEI therapy.  Surgery has now been rescheduled for 03/09/13.  Velna Ochs [facility] Short Stay Center/Anesthesiology Phone [phone] 03/06/2013 5:26 PM

## 2013-03-08 MED ORDER — CEFAZOLIN SODIUM-DEXTROSE 2-3 GM-% IV SOLR
2.0000 g | INTRAVENOUS | Status: AC
  Administered 2013-03-09 (×2): 2 g via INTRAVENOUS
  Filled 2013-03-08: qty 50

## 2013-03-08 MED ORDER — DEXAMETHASONE SODIUM PHOSPHATE 10 MG/ML IJ SOLN
10.0000 mg | INTRAMUSCULAR | Status: AC
  Administered 2013-03-09: 10 mg via INTRAVENOUS
  Filled 2013-03-08: qty 1

## 2013-03-09 ENCOUNTER — Inpatient Hospital Stay (HOSPITAL_COMMUNITY): Payer: Medicare Other | Admitting: Anesthesiology

## 2013-03-09 ENCOUNTER — Encounter (HOSPITAL_COMMUNITY): Payer: Self-pay | Admitting: Neurological Surgery

## 2013-03-09 ENCOUNTER — Inpatient Hospital Stay (HOSPITAL_COMMUNITY): Payer: Medicare Other

## 2013-03-09 ENCOUNTER — Inpatient Hospital Stay (HOSPITAL_COMMUNITY)
Admission: RE | Admit: 2013-03-09 | Discharge: 2013-03-13 | DRG: 460 | Disposition: A | Discharge status: Home / Self Care | Payer: Medicare Other | Source: Ambulatory Visit | Attending: Neurological Surgery | Admitting: Neurological Surgery

## 2013-03-09 ENCOUNTER — Encounter (HOSPITAL_COMMUNITY)
Admission: RE | Disposition: A | Discharge status: Home / Self Care | Payer: Medicare Other | Source: Ambulatory Visit | Attending: Neurological Surgery

## 2013-03-09 ENCOUNTER — Encounter (HOSPITAL_COMMUNITY): Payer: Medicare Other | Admitting: Vascular Surgery

## 2013-03-09 DIAGNOSIS — Z981 Arthrodesis status: Secondary | ICD-10-CM

## 2013-03-09 DIAGNOSIS — Z9079 Acquired absence of other genital organ(s): Secondary | ICD-10-CM

## 2013-03-09 DIAGNOSIS — E1142 Type 2 diabetes mellitus with diabetic polyneuropathy: Secondary | ICD-10-CM | POA: Diagnosis present

## 2013-03-09 DIAGNOSIS — I10 Essential (primary) hypertension: Secondary | ICD-10-CM | POA: Diagnosis present

## 2013-03-09 DIAGNOSIS — Z23 Encounter for immunization: Secondary | ICD-10-CM

## 2013-03-09 DIAGNOSIS — F411 Generalized anxiety disorder: Secondary | ICD-10-CM | POA: Diagnosis present

## 2013-03-09 DIAGNOSIS — Z8546 Personal history of malignant neoplasm of prostate: Secondary | ICD-10-CM

## 2013-03-09 DIAGNOSIS — M47817 Spondylosis without myelopathy or radiculopathy, lumbosacral region: Principal | ICD-10-CM | POA: Diagnosis present

## 2013-03-09 DIAGNOSIS — Z9089 Acquired absence of other organs: Secondary | ICD-10-CM

## 2013-03-09 DIAGNOSIS — E1349 Other specified diabetes mellitus with other diabetic neurological complication: Secondary | ICD-10-CM | POA: Diagnosis present

## 2013-03-09 DIAGNOSIS — K219 Gastro-esophageal reflux disease without esophagitis: Secondary | ICD-10-CM | POA: Diagnosis present

## 2013-03-09 DIAGNOSIS — Z79899 Other long term (current) drug therapy: Secondary | ICD-10-CM

## 2013-03-09 HISTORY — PX: MAXIMUM ACCESS (MAS)POSTERIOR LUMBAR INTERBODY FUSION (PLIF) 2 LEVEL: SHX6369

## 2013-03-09 LAB — GLUCOSE, CAPILLARY
Glucose-Capillary: 100 mg/dL — ABNORMAL HIGH (ref 70–99)
Glucose-Capillary: 171 mg/dL — ABNORMAL HIGH (ref 70–99)

## 2013-03-09 SURGERY — FOR MAXIMUM ACCESS (MAS) POSTERIOR LUMBAR INTERBODY FUSION (PLIF) 2 LEVEL
Anesthesia: General | Site: Back | Wound class: Clean

## 2013-03-09 MED ORDER — CELECOXIB 200 MG PO CAPS
200.0000 mg | ORAL_CAPSULE | Freq: Two times a day (BID) | ORAL | Status: DC
  Administered 2013-03-09 – 2013-03-13 (×9): 200 mg via ORAL
  Filled 2013-03-09 (×13): qty 1

## 2013-03-09 MED ORDER — POTASSIUM CHLORIDE IN NACL 20-0.9 MEQ/L-% IV SOLN
INTRAVENOUS | Status: DC
  Administered 2013-03-09: 17:00:00 via INTRAVENOUS
  Filled 2013-03-09 (×10): qty 1000

## 2013-03-09 MED ORDER — METFORMIN HCL 500 MG PO TABS
1000.0000 mg | ORAL_TABLET | Freq: Two times a day (BID) | ORAL | Status: DC
  Administered 2013-03-09 – 2013-03-13 (×9): 1000 mg via ORAL
  Filled 2013-03-09 (×12): qty 2

## 2013-03-09 MED ORDER — SODIUM CHLORIDE 0.9 % IV SOLN
INTRAVENOUS | Status: DC | PRN
  Administered 2013-03-09: 11:00:00 via INTRAVENOUS

## 2013-03-09 MED ORDER — CEFAZOLIN SODIUM 1-5 GM-% IV SOLN
INTRAVENOUS | Status: AC
  Filled 2013-03-09: qty 50

## 2013-03-09 MED ORDER — LIDOCAINE HCL (CARDIAC) 20 MG/ML IV SOLN
INTRAVENOUS | Status: DC | PRN
  Administered 2013-03-09: 80 mg via INTRAVENOUS

## 2013-03-09 MED ORDER — MORPHINE SULFATE 2 MG/ML IJ SOLN
1.0000 mg | INTRAMUSCULAR | Status: DC | PRN
  Administered 2013-03-09 (×2): 2 mg via INTRAVENOUS
  Administered 2013-03-10 (×2): 4 mg via INTRAVENOUS
  Administered 2013-03-10 – 2013-03-11 (×5): 2 mg via INTRAVENOUS
  Filled 2013-03-09 (×2): qty 1
  Filled 2013-03-09: qty 2
  Filled 2013-03-09 (×5): qty 1
  Filled 2013-03-09: qty 2

## 2013-03-09 MED ORDER — ACETAMINOPHEN 650 MG RE SUPP: 650.0000 mg | RECTAL | Status: DC | PRN

## 2013-03-09 MED ORDER — THROMBIN 20000 UNITS EX SOLR
CUTANEOUS | Status: DC | PRN
  Administered 2013-03-09 (×2): via TOPICAL

## 2013-03-09 MED ORDER — OXYCODONE HCL 5 MG/5ML PO SOLN: 5.0000 mg | Freq: Once | ORAL | Status: AC | PRN

## 2013-03-09 MED ORDER — SODIUM CHLORIDE 0.9 % IJ SOLN: 3.0000 mL | INTRAMUSCULAR | Status: DC | PRN

## 2013-03-09 MED ORDER — GLYCOPYRROLATE 0.2 MG/ML IJ SOLN
INTRAMUSCULAR | Status: DC | PRN
  Administered 2013-03-09: 0.6 mg via INTRAVENOUS

## 2013-03-09 MED ORDER — HYDROMORPHONE HCL PF 1 MG/ML IJ SOLN: 0.5000 mg | Freq: Once | INTRAMUSCULAR | Status: DC

## 2013-03-09 MED ORDER — ARTIFICIAL TEARS OP OINT
TOPICAL_OINTMENT | OPHTHALMIC | Status: DC | PRN
  Administered 2013-03-09: 1 via OPHTHALMIC

## 2013-03-09 MED ORDER — OXYCODONE HCL 5 MG PO TABS
ORAL_TABLET | ORAL | Status: AC
  Filled 2013-03-09: qty 1

## 2013-03-09 MED ORDER — PHENOL 1.4 % MT LIQD: 1.0000 | OROMUCOSAL | Status: DC | PRN

## 2013-03-09 MED ORDER — MORPHINE SULFATE ER 30 MG PO CP24: 30.0000 mg | ORAL_CAPSULE | Freq: Two times a day (BID) | ORAL | Status: DC

## 2013-03-09 MED ORDER — OXYCODONE HCL 5 MG PO TABS
5.0000 mg | ORAL_TABLET | Freq: Once | ORAL | Status: AC | PRN
  Administered 2013-03-09: 5 mg via ORAL

## 2013-03-09 MED ORDER — ONDANSETRON HCL 4 MG/2ML IJ SOLN: 4.0000 mg | INTRAMUSCULAR | Status: DC | PRN

## 2013-03-09 MED ORDER — MIDAZOLAM HCL 5 MG/5ML IJ SOLN
INTRAMUSCULAR | Status: DC | PRN
  Administered 2013-03-09: 2 mg via INTRAVENOUS

## 2013-03-09 MED ORDER — SENNA 8.6 MG PO TABS
1.0000 | ORAL_TABLET | Freq: Two times a day (BID) | ORAL | Status: DC
  Administered 2013-03-09 – 2013-03-13 (×8): 8.6 mg via ORAL
  Filled 2013-03-09 (×8): qty 1

## 2013-03-09 MED ORDER — 0.9 % SODIUM CHLORIDE (POUR BTL) OPTIME
TOPICAL | Status: DC | PRN
  Administered 2013-03-09: 1000 mL

## 2013-03-09 MED ORDER — NEOSTIGMINE METHYLSULFATE 1 MG/ML IJ SOLN
INTRAMUSCULAR | Status: DC | PRN
  Administered 2013-03-09: 3 mg via INTRAVENOUS

## 2013-03-09 MED ORDER — SODIUM CHLORIDE 0.9 % IV SOLN: 250.0000 mL | INTRAVENOUS | Status: DC

## 2013-03-09 MED ORDER — PNEUMOCOCCAL VAC POLYVALENT 25 MCG/0.5ML IJ INJ
0.5000 mL | INJECTION | INTRAMUSCULAR | Status: AC
  Administered 2013-03-12: 0.5 mL via INTRAMUSCULAR
  Filled 2013-03-09 (×2): qty 0.5

## 2013-03-09 MED ORDER — BUPIVACAINE HCL (PF) 0.25 % IJ SOLN
INTRAMUSCULAR | Status: DC | PRN
  Administered 2013-03-09: 10 mL

## 2013-03-09 MED ORDER — INFLUENZA VAC SPLIT QUAD 0.5 ML IM SUSP
0.5000 mL | INTRAMUSCULAR | Status: AC
  Administered 2013-03-11: 0.5 mL via INTRAMUSCULAR
  Filled 2013-03-09: qty 0.5

## 2013-03-09 MED ORDER — SODIUM CHLORIDE 0.9 % IR SOLN
Status: DC | PRN
  Administered 2013-03-09: 08:00:00

## 2013-03-09 MED ORDER — PHENYLEPHRINE HCL 10 MG/ML IJ SOLN
INTRAMUSCULAR | Status: DC | PRN
  Administered 2013-03-09 (×5): 40 ug via INTRAVENOUS
  Administered 2013-03-09: 80 ug via INTRAVENOUS

## 2013-03-09 MED ORDER — INSULIN ASPART 100 UNIT/ML ~~LOC~~ SOLN
0.0000 [IU] | Freq: Three times a day (TID) | SUBCUTANEOUS | Status: DC
  Administered 2013-03-09: 3 [IU] via SUBCUTANEOUS
  Administered 2013-03-10 – 2013-03-11 (×3): 2 [IU] via SUBCUTANEOUS
  Administered 2013-03-13: 3 [IU] via SUBCUTANEOUS
  Administered 2013-03-13: 2 [IU] via SUBCUTANEOUS

## 2013-03-09 MED ORDER — PANTOPRAZOLE SODIUM 20 MG PO TBEC
20.0000 mg | DELAYED_RELEASE_TABLET | Freq: Every day | ORAL | Status: DC
  Administered 2013-03-09 – 2013-03-13 (×5): 20 mg via ORAL
  Filled 2013-03-09 (×5): qty 1

## 2013-03-09 MED ORDER — LISINOPRIL 10 MG PO TABS
10.0000 mg | ORAL_TABLET | Freq: Every day | ORAL | Status: DC
  Administered 2013-03-09 – 2013-03-12 (×4): 10 mg via ORAL
  Filled 2013-03-09 (×5): qty 1

## 2013-03-09 MED ORDER — PHENYLEPHRINE HCL 10 MG/ML IJ SOLN
10.0000 mg | INTRAVENOUS | Status: DC | PRN
  Administered 2013-03-09: 50 ug/min via INTRAVENOUS

## 2013-03-09 MED ORDER — HYDROMORPHONE HCL PF 1 MG/ML IJ SOLN
INTRAMUSCULAR | Status: AC
  Filled 2013-03-09: qty 1

## 2013-03-09 MED ORDER — LACTATED RINGERS IV SOLN
INTRAVENOUS | Status: DC | PRN
  Administered 2013-03-09 (×5): via INTRAVENOUS

## 2013-03-09 MED ORDER — ROCURONIUM BROMIDE 100 MG/10ML IV SOLN
INTRAVENOUS | Status: DC | PRN
  Administered 2013-03-09: 20 mg via INTRAVENOUS
  Administered 2013-03-09: 60 mg via INTRAVENOUS
  Administered 2013-03-09: 10 mg via INTRAVENOUS
  Administered 2013-03-09: 20 mg via INTRAVENOUS
  Administered 2013-03-09 (×2): 10 mg via INTRAVENOUS

## 2013-03-09 MED ORDER — PROMETHAZINE HCL 25 MG/ML IJ SOLN: 6.2500 mg | INTRAMUSCULAR | Status: DC | PRN

## 2013-03-09 MED ORDER — ALPRAZOLAM ER 1 MG PO TB24: 1.0000 mg | ORAL_TABLET | Freq: Every day | ORAL | Status: DC

## 2013-03-09 MED ORDER — THROMBIN 5000 UNITS EX SOLR
OROMUCOSAL | Status: DC | PRN
  Administered 2013-03-09 (×2): via TOPICAL

## 2013-03-09 MED ORDER — ALPRAZOLAM 0.5 MG PO TABS
1.0000 mg | ORAL_TABLET | Freq: Every day | ORAL | Status: DC
  Administered 2013-03-09 – 2013-03-12 (×4): 1 mg via ORAL
  Filled 2013-03-09 (×4): qty 2

## 2013-03-09 MED ORDER — HYDROMORPHONE HCL PF 1 MG/ML IJ SOLN
0.2500 mg | INTRAMUSCULAR | Status: DC | PRN
  Administered 2013-03-09 (×4): 0.5 mg via INTRAVENOUS

## 2013-03-09 MED ORDER — PROPOFOL 10 MG/ML IV BOLUS
INTRAVENOUS | Status: DC | PRN
  Administered 2013-03-09: 200 mg via INTRAVENOUS

## 2013-03-09 MED ORDER — CARVEDILOL 3.125 MG PO TABS
ORAL_TABLET | ORAL | Status: AC
  Filled 2013-03-09: qty 1

## 2013-03-09 MED ORDER — CEFAZOLIN SODIUM 1-5 GM-% IV SOLN
1.0000 g | Freq: Three times a day (TID) | INTRAVENOUS | Status: AC
  Administered 2013-03-09 – 2013-03-10 (×2): 1 g via INTRAVENOUS
  Filled 2013-03-09 (×2): qty 50

## 2013-03-09 MED ORDER — ALPRAZOLAM ER 0.5 MG PO TB24
0.5000 mg | ORAL_TABLET | Freq: Every day | ORAL | Status: DC
  Administered 2013-03-10 – 2013-03-13 (×4): 0.5 mg via ORAL
  Filled 2013-03-09 (×4): qty 1

## 2013-03-09 MED ORDER — ONDANSETRON HCL 4 MG/2ML IJ SOLN
INTRAMUSCULAR | Status: DC | PRN
  Administered 2013-03-09: 4 mg via INTRAVENOUS

## 2013-03-09 MED ORDER — GABAPENTIN 600 MG PO TABS
600.0000 mg | ORAL_TABLET | Freq: Two times a day (BID) | ORAL | Status: DC
  Administered 2013-03-09 – 2013-03-13 (×8): 600 mg via ORAL
  Filled 2013-03-09 (×12): qty 1

## 2013-03-09 MED ORDER — MORPHINE SULFATE ER 15 MG PO TBCR
30.0000 mg | EXTENDED_RELEASE_TABLET | Freq: Two times a day (BID) | ORAL | Status: DC
  Administered 2013-03-09 – 2013-03-13 (×8): 30 mg via ORAL
  Filled 2013-03-09 (×8): qty 2

## 2013-03-09 MED ORDER — HYDROMORPHONE HCL 2 MG PO TABS
4.0000 mg | ORAL_TABLET | ORAL | Status: DC | PRN
  Administered 2013-03-09 – 2013-03-13 (×13): 4 mg via ORAL
  Filled 2013-03-09 (×13): qty 2

## 2013-03-09 MED ORDER — GLIPIZIDE ER 2.5 MG PO TB24
2.5000 mg | ORAL_TABLET | Freq: Two times a day (BID) | ORAL | Status: DC
  Administered 2013-03-09 – 2013-03-13 (×8): 2.5 mg via ORAL
  Filled 2013-03-09 (×9): qty 1

## 2013-03-09 MED ORDER — FENTANYL CITRATE 0.05 MG/ML IJ SOLN
INTRAMUSCULAR | Status: DC | PRN
  Administered 2013-03-09 (×2): 50 ug via INTRAVENOUS
  Administered 2013-03-09: 100 ug via INTRAVENOUS
  Administered 2013-03-09 (×2): 50 ug via INTRAVENOUS

## 2013-03-09 MED ORDER — CARVEDILOL 3.125 MG PO TABS
3.1250 mg | ORAL_TABLET | Freq: Two times a day (BID) | ORAL | Status: DC
  Administered 2013-03-09: 3.125 mg via ORAL
  Filled 2013-03-09 (×4): qty 1

## 2013-03-09 MED ORDER — ACETAMINOPHEN 325 MG PO TABS: 650.0000 mg | ORAL_TABLET | ORAL | Status: DC | PRN

## 2013-03-09 MED ORDER — SODIUM CHLORIDE 0.9 % IJ SOLN
3.0000 mL | Freq: Two times a day (BID) | INTRAMUSCULAR | Status: DC
  Administered 2013-03-09 – 2013-03-13 (×7): 3 mL via INTRAVENOUS

## 2013-03-09 MED ORDER — CARVEDILOL 3.125 MG PO TABS
3.1250 mg | ORAL_TABLET | Freq: Two times a day (BID) | ORAL | Status: DC
  Administered 2013-03-09 – 2013-03-13 (×8): 3.125 mg via ORAL
  Filled 2013-03-09 (×11): qty 1

## 2013-03-09 MED ORDER — MENTHOL 3 MG MT LOZG: 1.0000 | LOZENGE | OROMUCOSAL | Status: DC | PRN

## 2013-03-09 SURGICAL SUPPLY — 64 items
APL SKNCLS STERI-STRIP NONHPOA (GAUZE/BANDAGES/DRESSINGS) ×1
BAG DECANTER FOR FLEXI CONT (MISCELLANEOUS) ×2 IMPLANT
BENZOIN TINCTURE PRP APPL 2/3 (GAUZE/BANDAGES/DRESSINGS) ×2 IMPLANT
BLADE SURG ROTATE 9660 (MISCELLANEOUS) IMPLANT
BUR MATCHSTICK NEURO 3.0 LAGG (BURR) ×2 IMPLANT
CAGE COROENT 10X9X28-8 LUMBAR (Cage) ×2 IMPLANT
CAGE COROENT LRG MP 12X9X28-8 (Cage) ×2 IMPLANT
CANISTER SUCT 3000ML (MISCELLANEOUS) ×2 IMPLANT
CAP LCK SPNE (Orthopedic Implant) ×6 IMPLANT
CAP LOCK SPINE RADIUS (Orthopedic Implant) IMPLANT
CAP LOCKING (Orthopedic Implant) ×12 IMPLANT
CONT SPEC 4OZ CLIKSEAL STRL BL (MISCELLANEOUS) ×4 IMPLANT
COVER BACK TABLE 24X17X13 BIG (DRAPES) IMPLANT
COVER TABLE BACK 60X90 (DRAPES) ×2 IMPLANT
DRAPE C-ARM 42X72 X-RAY (DRAPES) ×2 IMPLANT
DRAPE C-ARMOR (DRAPES) ×2 IMPLANT
DRAPE LAPAROTOMY 100X72X124 (DRAPES) ×2 IMPLANT
DRAPE POUCH INSTRU U-SHP 10X18 (DRAPES) ×2 IMPLANT
DRAPE SURG 17X23 STRL (DRAPES) ×2 IMPLANT
DRESSING TELFA 8X3 (GAUZE/BANDAGES/DRESSINGS) ×1 IMPLANT
DRSG OPSITE 4X5.5 SM (GAUZE/BANDAGES/DRESSINGS) ×3 IMPLANT
DRSG OPSITE POSTOP 4X10 (GAUZE/BANDAGES/DRESSINGS) ×1 IMPLANT
DURAPREP 26ML APPLICATOR (WOUND CARE) ×2 IMPLANT
ELECT REM PT RETURN 9FT ADLT (ELECTROSURGICAL) ×2
ELECTRODE REM PT RTRN 9FT ADLT (ELECTROSURGICAL) ×1 IMPLANT
EVACUATOR 1/8 PVC DRAIN (DRAIN) ×2 IMPLANT
GAUZE SPONGE 4X4 16PLY XRAY LF (GAUZE/BANDAGES/DRESSINGS) IMPLANT
GLOVE BIO SURGEON STRL SZ8 (GLOVE) ×4 IMPLANT
GLOVE BIOGEL PI IND STRL 7.0 (GLOVE) IMPLANT
GLOVE BIOGEL PI INDICATOR 7.0 (GLOVE) ×3
GLOVE ECLIPSE 7.5 STRL STRAW (GLOVE) ×1 IMPLANT
GLOVE OPTIFIT SS 6.5 STRL BRWN (GLOVE) ×4 IMPLANT
GOWN BRE IMP SLV AUR LG STRL (GOWN DISPOSABLE) ×2 IMPLANT
GOWN BRE IMP SLV AUR XL STRL (GOWN DISPOSABLE) ×4 IMPLANT
GOWN STRL REIN 2XL LVL4 (GOWN DISPOSABLE) IMPLANT
HEMOSTAT POWDER KIT SURGIFOAM (HEMOSTASIS) IMPLANT
KIT BASIN OR (CUSTOM PROCEDURE TRAY) ×2 IMPLANT
KIT ROOM TURNOVER OR (KITS) ×2 IMPLANT
MILL MEDIUM DISP (BLADE) ×1 IMPLANT
NDL HYPO 25X1 1.5 SAFETY (NEEDLE) ×1 IMPLANT
NEEDLE BONE MARROW 8GX6 FENEST (NEEDLE) ×1 IMPLANT
NEEDLE HYPO 25X1 1.5 SAFETY (NEEDLE) ×2 IMPLANT
NS IRRIG 1000ML POUR BTL (IV SOLUTION) ×2 IMPLANT
PACK FOAM VITOSS 10CC (Orthopedic Implant) ×1 IMPLANT
PACK LAMINECTOMY NEURO (CUSTOM PROCEDURE TRAY) ×2 IMPLANT
PAD ARMBOARD 7.5X6 YLW CONV (MISCELLANEOUS) ×6 IMPLANT
PATTIES SURGICAL 1X1 (DISPOSABLE) ×1 IMPLANT
ROD 100MM (Rod) ×4 IMPLANT
ROD SPNL 100X5.5XNS TI RDS (Rod) IMPLANT
SCREW 6.75X45MM (Screw) ×4 IMPLANT
SPONGE LAP 4X18 X RAY DECT (DISPOSABLE) ×1 IMPLANT
SPONGE SURGIFOAM ABS GEL 100 (HEMOSTASIS) ×2 IMPLANT
STRIP CLOSURE SKIN 1/2X4 (GAUZE/BANDAGES/DRESSINGS) ×3 IMPLANT
SUT VIC AB 0 CT1 18XCR BRD8 (SUTURE) ×1 IMPLANT
SUT VIC AB 0 CT1 8-18 (SUTURE) ×2
SUT VIC AB 2-0 CP2 18 (SUTURE) ×2 IMPLANT
SUT VIC AB 3-0 SH 8-18 (SUTURE) ×4 IMPLANT
SYR 20ML ECCENTRIC (SYRINGE) ×2 IMPLANT
SYR CONTROL 10ML LL (SYRINGE) ×1 IMPLANT
TAPE STRIPS DRAPE STRL (GAUZE/BANDAGES/DRESSINGS) ×1 IMPLANT
TOWEL OR 17X24 6PK STRL BLUE (TOWEL DISPOSABLE) ×2 IMPLANT
TOWEL OR 17X26 10 PK STRL BLUE (TOWEL DISPOSABLE) ×2 IMPLANT
TRAY FOLEY CATH 14FRSI W/METER (CATHETERS) ×2 IMPLANT
WATER STERILE IRR 1000ML POUR (IV SOLUTION) ×2 IMPLANT

## 2013-03-09 NOTE — Anesthesia Preprocedure Evaluation (Addendum)
Anesthesia Evaluation  Patient identified by MRN, date of birth, ID band Patient awake    Reviewed: Allergy & Precautions, H&P , NPO status   History of Anesthesia Complications (+) PONV  Airway Mallampati: I      Dental  (+) Teeth Intact and Dental Advisory Given   Pulmonary pneumonia -,  breath sounds clear to auscultation        Cardiovascular hypertension, Rhythm:Regular Rate:Normal     Neuro/Psych  Neuromuscular disease    GI/Hepatic GERD-  ,  Endo/Other  diabetes  Renal/GU      Musculoskeletal   Abdominal   Peds  Hematology   Anesthesia Other Findings   Reproductive/Obstetrics                          Anesthesia Physical Anesthesia Plan  ASA: III  Anesthesia Plan: General   Post-op Pain Management:    Induction: Intravenous  Airway Management Planned: Oral ETT  Additional Equipment:   Intra-op Plan:   Post-operative Plan: Extubation in OR  Informed Consent: I have reviewed the patients History and Physical, chart, labs and discussed the procedure including the risks, benefits and alternatives for the proposed anesthesia with the patient or authorized representative who has indicated his/her understanding and acceptance.   Dental advisory given  Plan Discussed with: CRNA and Surgeon  Anesthesia Plan Comments:         Anesthesia Quick Evaluation

## 2013-03-09 NOTE — Op Note (Signed)
03/09/2013  12:22 PM  PATIENT:  Joseph Bird  67 y.o. 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 Decompressive lumbar laminectomy L2-3 and L3-4 requiring more work than would be required for a simple exposure of the disk for PLIF in order to adequately decompress the neural elements and address the spinal stenosis 2. Posterior lumbar interbody fusion L2-3 and L3-4 using PEEK interbody cages packed with morcellized allograft and autograft 3. Posterior fixation L2-L4 using Stryker radius pedicle screws.  4. Intertransverse arthrodesis L2-L4 using morcellized autograft and allograft. 5. Removal of segmental instrumentation L4-S1 6. Harvesting of iliac crest bone marrow aspirate through separate fascial incision  SURGEON:  Marikay Alar, MD  ASSISTANTS: Dr. Phoebe Perch  ANESTHESIA:  General  EBL: 900 ml  Total I/O In: 4380 [I.V.:4050; Blood:330] Out: 1175 [Urine:275; Blood:900]  BLOOD ADMINISTERED:250 CC PRBC  DRAINS: Hemovac   INDICATION FOR PROCEDURE: this patient had undergone a previous L4-S1 fusion. He presented with severe back pain with leg pain. CT myelogram showed adjacent level spondylosis and stenosis at L2-3 and L3-4. He tried medical management without relief. I recommended a decompression and fusion. Patient understood the risks, benefits, and alternatives and potential outcomes and wished to proceed.  PROCEDURE DETAILS:  The patient was brought to the operating room. After induction of generalized endotracheal anesthesia the patient was rolled into the prone position on chest rolls and all pressure points were padded. The patient's lumbar region was cleaned and then prepped with DuraPrep and draped in the usual sterile fashion. Anesthesia was injected and then a dorsal midline incision was made and carried down to the lumbosacral  fascia. The fascia was opened and the paraspinous musculature was taken down in a subperiosteal fashion to expose L2-3 and L3-4 as well as the previous hardware from L4-S1. A self-retaining retractor was placed. Intraoperative fluoroscopy confirmed my level, and I started with removal of the old hardware. The locking caps were removed and then the rods were removed. L5 and S1 pedicle screws were removed bilaterally. I opened the fascia over the right iliac crest, identified the iliac crest, and the knee used a Jamshidi needle to extract about 20 cc of bone marrow aspirate from the right iliac crest. This area was then dried and closed with 2-0 Vicryl. I then turned my attention to the decompression and the spinous process was removed and complete lumbar laminectomies, hemi- facetectomies, and foraminotomies were performed at L2-3 and L3-4. The patient had significant spinal stenosis and this required more work than would be required for a simple exposure of the disc for posterior lumbar interbody fusion. Much more generous decompression was undertaken in order to adequately decompress the neural elements and address the patient's leg pain. The yellow ligament was removed to expose the underlying dura and nerve roots, and generous foraminotomies were performed to adequately decompress the neural elements. Both the exiting and traversing nerve roots were decompressed on both sides until a coronary dilator passed easily along the nerve roots. Once the decompression was complete, I turned my attention to the posterior lower lumbar interbody fusion. The epidural venous vasculature was coagulated and cut sharply. Disc space was incised and the initial discectomy was performed with pituitary rongeurs. The disc space was distracted with sequential distractors to a height of 13 mm at L3-4 and 11 mm at L2-3. We then used a series of scrapers and shavers  to prepare the endplates for fusion. The midline was prepared with  Epstein curettesat both levels. Once the complete discectomy was finished, we packed an appropriate sized peek interbody cage with local autograft and morcellized allograft, gently retracted the nerve root, and tapped the cage into position at L2-3 and L3-4 bilaterally.  The midline between the cages was packed with morselized autograft and allograft. We then turned our attention to the placement of the pedicle screws. The pedicle screw entry zones were identified utilizing surface landmarks and fluoroscopy. I used a pedicle probe to probe each pedicle and tapped each pedicle with the appropriate tap. We palpated with a ball probe to assure no break in the cortex. We then placed 6.75 x 45 mm pedicle screws into the pedicles bilaterally at L2 and L3 bilaterally. We then decorticated the transverse processes and laid a mixture of morcellized autograft and allograft out over these to perform intertransverse arthrodesis at L2-3 and L3-4 on the right. We then placed lordotic rods into the multiaxial screw heads of the pedicle screws and locked these in position with the locking caps and anti-torque device. We then checked our construct with AP and lateral fluoroscopy. Irrigated with copious amounts of bacitracin-containing saline solution. Placed a medium Hemovac drain through separate stab incision. Inspected the nerve roots once again to assure adequate decompression, lined to the dura with Gelfoam, and closed the muscle and the fascia with 0 Vicryl. Closed the subcutaneous tissues with 2-0 Vicryl and subcuticular tissues with 3-0 Vicryl. The skin was closed with benzoin and Steri-Strips. Dressing was then applied, the patient was awakened from general anesthesia and transported to the recovery room in stable condition. At the end of the procedure all sponge, needle and instrument counts were correct.   PLAN OF CARE: Admit to inpatient   PATIENT DISPOSITION:  PACU - hemodynamically stable.   Delay start of  Pharmacological VTE agent (>24hrs) due to surgical blood loss or risk of bleeding:  yes

## 2013-03-09 NOTE — Preoperative (Signed)
Beta Blockers   Reason not to administer Beta Blockers:Not Applicable 

## 2013-03-09 NOTE — H&P (Signed)
Subjective: Patient is a 67 y.o. male admitted for PLIF L2-3, L3-4. Previous lumbar fusion L4-S1. Onset of symptoms was months ago, gradually worse since that time.  The pain is rated intense and is located at the low back and radiates to legs. The pain is described as aching and occurs all day. The symptoms have been progressive. Symptoms are exacerbated by standing and walking. MRI or CT showed adjacent level stenosis.   Past Medical History  Diagnosis Date  . PONV (postoperative nausea and vomiting)   . Hypertension   . Anxiety   . Pneumonia     hx  . Diabetes mellitus without complication     fasting 80-90  . GERD (gastroesophageal reflux disease)   . Chronic nausea   . Neuropathy due to secondary diabetes   . Cancer 2010    prostate    Past Surgical History  Procedure Laterality Date  . Prostatectomy    . Cholecystectomy    . Back surgery      x3  . Eye surgery      right cataract  . Tonsillectomy    . Cardiac catheterization      Prior to Admission medications   Medication Sig Start Date End Date Taking? Authorizing Provider  ALPRAZolam (XANAX XR) 0.5 MG 24 hr tablet Take 0.5 mg by mouth daily.   Yes Historical Provider, MD  ALPRAZolam (XANAX XR) 1 MG 24 hr tablet Take 1 mg by mouth at bedtime.   Yes Historical Provider, MD  calcium-vitamin D (OSCAL WITH D) 500-200 MG-UNIT per tablet Take 1 tablet by mouth daily with breakfast.   Yes Historical Provider, MD  carvedilol (COREG) 3.125 MG tablet Take 3.125 mg by mouth 2 (two) times daily with a meal.   Yes Historical Provider, MD  Cholecalciferol (VITAMIN D) 2000 UNITS CAPS Take 2,000 Units by mouth daily.   Yes Historical Provider, MD  gabapentin (NEURONTIN) 600 MG tablet Take 600 mg by mouth 2 (two) times daily.   Yes Historical Provider, MD  glipiZIDE (GLUCOTROL XL) 2.5 MG 24 hr tablet Take 2.5 mg by mouth 2 (two) times daily.   Yes Historical Provider, MD  HYDROmorphone (DILAUDID) 2 MG tablet Take 2 mg by mouth every 4  (four) hours as needed for pain.   Yes Historical Provider, MD  lansoprazole (PREVACID) 15 MG capsule Take 15 mg by mouth daily.   Yes Historical Provider, MD  lisinopril (PRINIVIL,ZESTRIL) 20 MG tablet Take 10 mg by mouth at bedtime.    Yes Historical Provider, MD  metFORMIN (GLUCOPHAGE) 1000 MG tablet Take 1,000 mg by mouth 2 (two) times daily with a meal.   Yes Historical Provider, MD  morphine (KADIAN) 30 MG 24 hr capsule Take 30 mg by mouth 2 (two) times daily.   Yes Historical Provider, MD  ondansetron (ZOFRAN) 4 MG tablet Take 4 mg by mouth 3 (three) times daily.   Yes Historical Provider, MD  pravastatin (PRAVACHOL) 10 MG tablet Take 10 mg by mouth at bedtime.   Yes Historical Provider, MD   Allergies  Allergen Reactions  . Demerol [Meperidine] Other (See Comments)    "Made him crazy."    History  Substance Use Topics  . Smoking status: Never Smoker   . Smokeless tobacco: Never Used  . Alcohol Use: No    No family history on file.   Review of Systems  Positive ROS: neg  All other systems have been reviewed and were otherwise negative with the exception of those mentioned  in the HPI and as above.  Objective: Vital signs in last 24 hours:    General Appearance: Alert, cooperative, no distress, appears stated age Head: Normocephalic, without obvious abnormality, atraumatic Eyes: PERRL, conjunctiva/corneas clear, EOM's intact    Neck: Supple, symmetrical, trachea midline Back: Symmetric, no curvature, poor ROM,  no CVA tenderness Lungs:  respirations unlabored Heart: Regular rate and rhythm Abdomen: Soft, non-tender Extremities: Extremities normal, atraumatic, no cyanosis or edema Pulses: 2+ and symmetric all extremities Skin: Skin color, texture, turgor normal, no rashes or lesions  NEUROLOGIC:   Mental status: Alert and oriented x4,  no aphasia, good attention span, fund of knowledge, and memory Motor Exam - grossly normal Sensory Exam - grossly normal Reflexes:  1+ Coordination - grossly normal Gait - grossly normal Balance - grossly normal Cranial Nerves: I: smell Not tested  II: visual acuity  OS: nl    OD: nl  II: visual fields Full to confrontation  II: pupils Equal, round, reactive to light  III,VII: ptosis None  III,IV,VI: extraocular muscles  Full ROM  V: mastication Normal  V: facial light touch sensation  Normal  V,VII: corneal reflex  Present  VII: facial muscle function - upper  Normal  VII: facial muscle function - lower Normal  VIII: hearing Not tested  IX: soft palate elevation  Normal  IX,X: gag reflex Present  XI: trapezius strength  5/5  XI: sternocleidomastoid strength 5/5  XI: neck flexion strength  5/5  XII: tongue strength  Normal    Data Review Lab Results  Component Value Date   WBC 8.0 03/03/2013   HGB 15.8 03/03/2013   HCT 45.8 03/03/2013   MCV 91.2 03/03/2013   PLT 220 03/03/2013   Lab Results  Component Value Date   NA 140 03/03/2013   K 4.2 03/03/2013   CL 100 03/03/2013   CO2 28 03/03/2013   BUN 16 03/03/2013   CREATININE 0.99 03/03/2013   GLUCOSE 159* 03/03/2013   Lab Results  Component Value Date   INR 0.84 03/03/2013    Assessment/Plan: Patient admitted for PLIF L2-3, L4-5. Patient has failed a reasonable attempt at conservative therapy.  I explained the condition and procedure to the patient and answered any questions.  Patient wishes to proceed with procedure as planned. Understands risks/ benefits and typical outcomes of procedure.   JONES,DAVID S 03/09/2013 6:28 AM

## 2013-03-09 NOTE — Anesthesia Postprocedure Evaluation (Signed)
  Anesthesia Post-op Note  Patient: Joseph Bird  Procedure(s) Performed: Procedure(s) with comments: FOR MAXIMUM ACCESS (MAS) POSTERIOR LUMBAR INTERBODY FUSION (PLIF) 2 LEVEL (N/A) - POSTERIOR LUMBAR INTERBODY FUSION (PLIF) 2 LEVEL  Patient Location: PACU  Anesthesia Type:General  Level of Consciousness: awake and alert   Airway and Oxygen Therapy: Patient Spontanous Breathing  Post-op Pain: mild  Post-op Assessment: Post-op Vital signs reviewed  Post-op Vital Signs: stable  Complications: No apparent anesthesia complications

## 2013-03-09 NOTE — Transfer of Care (Signed)
Immediate Anesthesia Transfer of Care Note  Patient: Joseph Bird  Procedure(s) Performed: Procedure(s) with comments: FOR MAXIMUM ACCESS (MAS) POSTERIOR LUMBAR INTERBODY FUSION (PLIF) 2 LEVEL (N/A) - POSTERIOR LUMBAR INTERBODY FUSION (PLIF) 2 LEVEL  Patient Location: PACU  Anesthesia Type:General  Level of Consciousness: awake, alert  and oriented  Airway & Oxygen Therapy: Patient Spontanous Breathing and Patient connected to nasal cannula oxygen  Post-op Assessment: Report given to PACU RN  Post vital signs: Reviewed  Complications: No apparent anesthesia complications

## 2013-03-10 LAB — GLUCOSE, CAPILLARY
Glucose-Capillary: 138 mg/dL — ABNORMAL HIGH (ref 70–99)
Glucose-Capillary: 115 mg/dL — ABNORMAL HIGH (ref 70–99)
Glucose-Capillary: 127 mg/dL — ABNORMAL HIGH (ref 70–99)
Glucose-Capillary: 127 mg/dL — ABNORMAL HIGH (ref 70–99)

## 2013-03-10 NOTE — Progress Notes (Signed)
Nutrition Brief Note  Patient identified on the Malnutrition Screening Tool (MST) Report for recent weight lost without trying (patient unsure).  Per readings below, patient's weight has been trending up.  Wt Readings from Last 15 Encounters:  03/09/13 210 lb (95.255 kg)  03/09/13 210 lb (95.255 kg)  03/03/13 201 lb 7 oz (91.371 kg)  01/27/13 195 lb 11.2 oz (88.769 kg)    Body mass index is 29.3 kg/(m^2). Patient meets criteria for Overweight based on current BMI.   Current diet order is Carbohydrate Modified Medium Calorie.  Patient reports a good appetite.  Labs and medications reviewed.   No nutrition interventions warranted at this time. If nutrition issues arise, please consult RD.   Maureen Chatters, RD, LDN Pager #: [phone] After-Hours Pager #: [phone]

## 2013-03-10 NOTE — Evaluation (Signed)
Occupational Therapy Evaluation Patient Details Name: Joseph Bird MRN: [id number] DOB: 11-06-1945 Today's Date: 03/10/2013 Time: 4098-1191 OT Time Calculation (min): 26 min  OT Assessment / Plan / Recommendation History of present illness 67yo male s/p PLIF L2-3 L3-4   Clinical Impression   Patient is s/p PLIF L2-4 surgery resulting in functional limitations due to the deficits listed below (see OT problem list).  Patient will benefit from skilled OT acutely to increase independence and safety with ADLS to allow discharge HHOT.     OT Assessment  Patient needs continued OT Services    Follow Up Recommendations  Home health OT;Supervision - Intermittent    Barriers to Discharge      Equipment Recommendations  Other (comment) (RW)    Recommendations for Other Services    Frequency  Min 2X/week    Precautions / Restrictions Precautions Precautions: Back Precaution Comments: handout provided and reviewed with pt and spouse   Pertinent Vitals/Pain soreness    ADL  Grooming: Wash/dry hands;Supervision/safety Where Assessed - Grooming: Unsupported standing Lower Body Bathing: Supervision/safety Where Assessed - Lower Body Bathing: Unsupported sitting Lower Body Dressing: Supervision/safety Where Assessed - Lower Body Dressing: Supported sitting Toilet Transfer: Min Pension scheme manager Method: Sit to Barista: Regular height toilet;Grab bars Toileting - Clothing Manipulation and Hygiene: Minimal assistance Where Assessed - Engineer, mining and Hygiene: Sit to stand from 3-in-1 or toilet Equipment Used: Gait belt Transfers/Ambulation Related to ADLs: Pt ambulated with RW and min v/c for safety. pt with bil LE passing the front of RW incr risk of falls. Pt correcting errors after education ADL Comments: Pt supine on arrival and educated on bed mobility and back handout. Pt verbalized 3 out 3 precautions at the end of the session. Pt  complete bed mobility and able to cross bil LE at EOB touching feet. Recommend dressing LT LE first. Pt complete toilet transfer and voiding bladder. Pt static standing at sink and able to squat to get soap maintaining back precautions. Pt positioned in chair at end of session. pt is to be OOB for all meals and return to supine between meals for rest breaks.    OT Diagnosis: Generalized weakness;Acute pain  OT Problem List: Decreased strength;Decreased activity tolerance;Impaired balance (sitting and/or standing);Decreased safety awareness;Decreased knowledge of use of DME or AE;Decreased knowledge of precautions;Pain OT Treatment Interventions: Self-care/ADL training;Therapeutic exercise;DME and/or AE instruction;Therapeutic activities;Patient/family education;Balance training   OT Goals(Current goals can be found in the care plan section) Acute Rehab OT Goals Patient Stated Goal: to get better OT Goal Formulation: With patient/family Time For Goal Achievement: 03/24/13 Potential to Achieve Goals: Good ADL Goals Pt Will Transfer to Toilet: with modified independence;regular height toilet Pt Will Perform Tub/Shower Transfer: Tub transfer;with supervision;ambulating  Visit Information  Last OT Received On: 03/10/13 Assistance Needed: +1 PT/OT Co-Evaluation/Treatment: Yes History of Present Illness: 67yo male s/p PLIF L2-3 L3-4       Prior Functioning     Home Living Family/patient expects to be discharged to:: Private residence Living Arrangements: Spouse/significant other;Children Available Help at Discharge: Family Type of Home: House Home Access: Stairs to enter Secretary/administrator of Steps: 1 Entrance Stairs-Rails: None Home Layout: Able to live on main level with bedroom/bathroom Home Equipment: Cane - single point Prior Function Level of Independence: Independent Communication Communication: No difficulties Dominant Hand: Right         Vision/Perception Vision  - History Baseline Vision: No visual deficits Patient Visual Report: No change from baseline  Cognition  Cognition Arousal/Alertness: Awake/alert Behavior During Therapy: WFL for tasks assessed/performed Overall Cognitive Status: Within Functional Limits for tasks assessed    Extremity/Trunk Assessment Upper Extremity Assessment Upper Extremity Assessment: Overall WFL for tasks assessed Lower Extremity Assessment Lower Extremity Assessment: Defer to PT evaluation Cervical / Trunk Assessment Cervical / Trunk Assessment: Normal     Mobility Bed Mobility Bed Mobility: Rolling Left;Left Sidelying to Sit;Supine to Sit;Sitting - Scoot to [place] of Bed Rolling Left: 4: Min assist;With rail Left Sidelying to Sit: 4: Min assist;With rails Supine to Sit: 4: Min assist;With rails Sitting - Scoot to [place] of Bed: 4: Min guard Details for Bed Mobility Assistance: Pt educated on sequence with log roll. Pt with good return demo. Pt needed (A) from side to sit Transfers Transfers: Stand to Sit;Sit to Stand Sit to Stand: 4: Min guard;With upper extremity assist;From bed (cues for hand placement) Stand to Sit: 4: Min guard;With upper extremity assist;To chair/3-in-1 Details for Transfer Assistance: pt needed cues for hand placement and safety with RW     Exercise     Balance     End of Session OT - End of Session Activity Tolerance: Patient tolerated treatment well Patient left: in chair;with call bell/phone within reach;with family/visitor present Nurse Communication: Mobility status;Precautions  GO     Joseph Bird 03/10/2013, 10:29 AM Pager: [phone]

## 2013-03-10 NOTE — Progress Notes (Signed)
Patient had fluid filled blister on right side of chin.  Per patient report, MD advised patient to open the blister to allow it to drain.  Patient opened blister.  Sterile dressing applied.  Will continue to monitor.

## 2013-03-10 NOTE — Evaluation (Signed)
Physical Therapy Evaluation Patient Details Name: Joseph Bird MRN: [id number] DOB: May 03, 1946 Today's Date: 03/10/2013 Time: 0950-1020 PT Time Calculation (min): 30 min  PT Assessment / Plan / Recommendation History of Present Illness  67yo male s/p PLIF L2-3 L3-4  Clinical Impression  Patient demonstrates deficits in functional mobility as indicated below. Patient will benefit from continued skilled PT to address deficits and maximize function. Will continue to see and progress activity as tolerated. Rec HHPt upon discharge with family assist.    PT Assessment  Patient needs continued PT services    Follow Up Recommendations  Home health PT;Supervision/Assistance - 24 hour          Equipment Recommendations  Rolling walker with 5" wheels       Frequency Min 5X/week    Precautions / Restrictions Precautions Precautions: Back Precaution Comments: handout provided and reviewed with pt and spouse Restrictions Weight Bearing Restrictions: No   Pertinent Vitals/Pain 6/10      Mobility  Bed Mobility Bed Mobility: Rolling Left;Left Sidelying to Sit;Supine to Sit;Sitting - Scoot to [place] of Bed Rolling Left: 4: Min assist;With rail Left Sidelying to Sit: 4: Min assist;With rails Supine to Sit: 4: Min assist;With rails Sitting - Scoot to [place] of Bed: 4: Min guard Details for Bed Mobility Assistance: Pt educated on sequence with log roll. Pt with good return demo. Pt needed (A) from side to sit Transfers Transfers: Sit to Stand;Stand to Sit Sit to Stand: 4: Min guard;With upper extremity assist;From bed (cues for hand placement) Stand to Sit: 4: Min guard;With upper extremity assist;To chair/3-in-1 Details for Transfer Assistance: pt needed cues for hand placement and safety with RW Ambulation/Gait Ambulation/Gait Assistance: 4: Min guard Ambulation Distance (Feet): 40 Feet Assistive device: Rolling walker Ambulation/Gait Assistance Details: rigid gait limited by pain,  Educated on proper turns with compliance for precautions Gait Pattern: Step-through pattern;Decreased stride length Gait velocity: decreased  General Gait Details: steady no noted LOB    Exercises     PT Diagnosis: Difficulty walking;Acute pain  PT Problem List: Decreased strength;Decreased activity tolerance;Decreased balance;Decreased mobility;Decreased knowledge of use of DME;Pain PT Treatment Interventions: DME instruction;Gait training;Stair training;Functional mobility training;Therapeutic activities;Therapeutic exercise;Balance training;Patient/family education     PT Goals(Current goals can be found in the care plan section) Acute Rehab PT Goals Patient Stated Goal: to get better PT Goal Formulation: With patient/family Time For Goal Achievement: 03/24/13 Potential to Achieve Goals: Good  Visit Information  Last PT Received On: 03/10/13 Assistance Needed: +1 History of Present Illness: 67yo male s/p PLIF L2-3 L3-4       Prior Functioning  Home Living Family/patient expects to be discharged to:: Private residence Living Arrangements: Spouse/significant other;Children Available Help at Discharge: Family Type of Home: House Home Access: Stairs to enter Secretary/administrator of Steps: 1 Entrance Stairs-Rails: None Home Layout: Able to live on main level with bedroom/bathroom Home Equipment: Cane - single point Prior Function Level of Independence: Independent Communication Communication: No difficulties Dominant Hand: Right    Cognition  Cognition Arousal/Alertness: Awake/alert Behavior During Therapy: WFL for tasks assessed/performed Overall Cognitive Status: Within Functional Limits for tasks assessed    Extremity/Trunk Assessment Upper Extremity Assessment Upper Extremity Assessment: Overall WFL for tasks assessed Lower Extremity Assessment Lower Extremity Assessment: Overall WFL for tasks assessed Cervical / Trunk Assessment Cervical / Trunk Assessment:  Normal   Balance Balance Balance Assessed: Yes High Level Balance High Level Balance Activites: Side stepping;Backward walking;Direction changes;Turns;Head turns High Level Balance Comments: steady, cues for  precautions with turns  End of Session PT - End of Session Equipment Utilized During Treatment: Gait belt Activity Tolerance: Patient tolerated treatment well;Patient limited by fatigue;Patient limited by pain Patient left: in chair;with call bell/phone within reach;with family/visitor present Nurse Communication: Mobility status  GP     Fabio Asa 03/10/2013, 11:23 AM Charlotte Crumb, PT DPT  [phone]

## 2013-03-11 LAB — GLUCOSE, CAPILLARY
Glucose-Capillary: 111 mg/dL — ABNORMAL HIGH (ref 70–99)
Glucose-Capillary: 87 mg/dL (ref 70–99)
Glucose-Capillary: 140 mg/dL — ABNORMAL HIGH (ref 70–99)

## 2013-03-11 NOTE — Progress Notes (Signed)
Physical Therapy Treatment Patient Details Name: Joseph Bird MRN: [id number] DOB: 1945/09/27 Today's Date: 03/11/2013 Time: 1033-1100 PT Time Calculation (min): 27 min  PT Assessment / Plan / Recommendation  History of Present Illness 67yo male s/p PLIF L2-3 L3-4   PT Comments   Pt making steady progress.  Pt continues to remain very rigid with mobility and needs cueing to relax.    Follow Up Recommendations  Home health PT;Supervision - Intermittent     Does the patient have the potential to tolerate intense rehabilitation     Barriers to Discharge        Equipment Recommendations  Rolling walker with 5" wheels    Recommendations for Other Services    Frequency Min 5X/week   Progress towards PT Goals Progress towards PT goals: Progressing toward goals  Plan Current plan remains appropriate    Precautions / Restrictions Precautions Precautions: Back Precaution Comments: pt able to verbalize 2/3 back precautions.  Reviewed back precautions.   Restrictions Weight Bearing Restrictions: No   Pertinent Vitals/Pain Indicates pain 4-5/10 in back.  Premedicated.      Mobility  Bed Mobility Bed Mobility: Rolling Left;Left Sidelying to Sit;Sitting - Scoot to Edge of Bed Rolling Left: 4: Min guard;With rail Left Sidelying to Sit: 4: Min assist;With rails Sitting - Scoot to Edge of Bed: 5: Supervision Details for Bed Mobility Assistance: cues for log roll technique and back precautions.   Transfers Transfers: Sit to Stand;Stand to Sit Sit to Stand: 4: Min guard;With upper extremity assist;From bed Stand to Sit: 4: Min guard;With upper extremity assist;To chair/3-in-1 Details for Transfer Assistance: cues for UE sue and controlling descent to 3-in-1.   Ambulation/Gait Ambulation/Gait Assistance: 4: Min guard Ambulation Distance (Feet): 150 Feet Assistive device: Rolling walker Ambulation/Gait Assistance Details: pt continues to be very rigid and requires cueing to relax  shoulders and UEs.  cues for positioning within RW during turns and encouragement for increased distance as pt seems nervous.   Gait Pattern: Step-through pattern;Decreased stride length Stairs: No Wheelchair Mobility Wheelchair Mobility: No    Exercises     PT Diagnosis:    PT Problem List:   PT Treatment Interventions:     PT Goals (current goals can now be found in the care plan section) Acute Rehab PT Goals Patient Stated Goal: to get better Time For Goal Achievement: 03/24/13 Potential to Achieve Goals: Good  Visit Information  Last PT Received On: 03/11/13 Assistance Needed: +1 History of Present Illness: 67yo male s/p PLIF L2-3 L3-4    Subjective Data  Patient Stated Goal: to get better   Cognition  Cognition Arousal/Alertness: Awake/alert Behavior During Therapy: WFL for tasks assessed/performed Overall Cognitive Status: Within Functional Limits for tasks assessed    Balance  Balance Balance Assessed: No  End of Session PT - End of Session Equipment Utilized During Treatment: Gait belt Activity Tolerance: Patient tolerated treatment well Patient left: with family/visitor present (in bathroom with wife.  ) Nurse Communication: Mobility status   GP     RitenourAlison Murray, [place] [phone] 03/11/2013, 2:46 PM

## 2013-03-11 NOTE — Progress Notes (Signed)
Pt with small raised area at hemovac insertion site. Per previous RN the MD is aware. Patient and wife state the site has decreased in size. No complaints noted.

## 2013-03-11 NOTE — Progress Notes (Signed)
Filed Vitals:   03/10/13 2025 03/11/13 0036 03/11/13 0525 03/11/13 0747  BP: 121/71 125/76 127/81 126/111  Pulse: 96 84 92 89  Temp: 98.9 F (37.2 C) 98.9 F (37.2 C) 99.8 F (37.7 C)   TempSrc: Oral Oral Oral   Resp: 18 18 18    Height:      Weight:      SpO2: 94% 95% 93%     Patient sitting up in chair, having just finished breakfast. Fairly comfortable. Has ambulated in the room, but is not walking the hall since surgery.  Hemovac drain has had steadily decreasing output, dressing removed, we'll left open to air, drain removed, and dressing placed over drain site.  Plan: Encouraged to increase ambulation, and I've asked the patient to ambulate in the halls at least 4 times today.  Hewitt Shorts, MD 03/11/2013, 8:22 AM

## 2013-03-11 NOTE — Progress Notes (Signed)
Occupational Therapy Treatment Patient Details Name: Joseph Bird MRN: [id number] DOB: 1945-10-13 Today's Date: 03/11/2013 Time: 7829-5621 OT Time Calculation (min): 48 min  OT Assessment / Plan / Recommendation  History of present illness 67yo male s/p PLIF L2-3 L3-4   OT comments  Practiced tub transfer and ambulated to gym and back to room. Practiced with reacher and sockaid. OT provided education to pt and his daughter.   Follow Up Recommendations  Home health OT;Supervision - Intermittent    Barriers to Discharge       Equipment Recommendations  Other (comment);3 in 1 bedside comode (RW)    Recommendations for Other Services    Frequency Min 2X/week   Progress towards OT Goals Progress towards OT goals: Progressing toward goals  Plan Discharge plan remains appropriate    Precautions / Restrictions Precautions Precautions: Back Precaution Comments: pt able to state 2/3 back precautions. Reviewed with him Restrictions Weight Bearing Restrictions: No   Pertinent Vitals/Pain     ADL  Grooming: Performed;Teeth care;Set up;Supervision/safety;Wash/dry hands Where Assessed - Grooming: Supported standing Lower Body Dressing: Set up;Supervision/safety Where Assessed - Lower Body Dressing: Supported sitting (socks) Toilet Transfer: Hydrographic surveyor Method: Sit to Barista: Raised toilet seat with arms (or 3-in-1 over toilet) Toileting - Clothing Manipulation and Hygiene: Min guard Where Assessed - Toileting Clothing Manipulation and Hygiene: Sit to stand from 3-in-1 or toilet Tub/Shower Transfer: Min guard Tub/Shower Transfer Method: Science writer: Other (comment) (practiced stepping over) Equipment Used: Gait belt;Rolling walker;Reacher;Sock aid;Long-handled sponge;Long-handled shoe horn;Other (comment) (toilet aid) Transfers/Ambulation Related to ADLs: Min guard for ambulation and transfers. ADL Comments:  Educated on use of two cups for teeth care as well as placing grooming items on right side of sink to avoid breaking precautions. Educated on toilet aid for hygiene. Educated pt to have rugs picked up (non skid in bathroom) and educated on use of bag on walker. Pt practiced simulated tub transfer and pt at [facility] guard level. Educated to sit to bathe and have wife with him. Pt wanting to see AE for LB ADLs. Pt able to cross legs and doff socks, but appeared to be breaking precautions when donning. Practiced with sockaid and reacher.     OT Diagnosis:    OT Problem List:   OT Treatment Interventions:     OT Goals(current goals can now be found in the care plan section) Acute Rehab OT Goals Patient Stated Goal: not stated OT Goal Formulation: With patient/family Time For Goal Achievement: 03/24/13 Potential to Achieve Goals: Good ADL Goals Pt Will Transfer to Toilet: with modified independence (3 in 1 over commode) Pt Will Perform Tub/Shower Transfer: Tub transfer;with supervision;ambulating  Visit Information  Last OT Received On: 03/11/13 Assistance Needed: +1 History of Present Illness: 66yo male s/p PLIF L2-3 L3-4    Subjective Data      Prior Functioning       Cognition  Cognition Arousal/Alertness: Awake/alert Behavior During Therapy: WFL for tasks assessed/performed Overall Cognitive Status: Within Functional Limits for tasks assessed    Mobility  Bed Mobility Bed Mobility: Sit to Sidelying Left;Rolling Right;Rolling Left;Left Sidelying to Sit Rolling Right: 4: Min guard Rolling Left: 5: Supervision Left Sidelying to Sit: 4: Min guard Details for Bed Mobility Assistance: Cues for technique Transfers Transfers: Sit to Stand;Stand to Sit Sit to Stand: 4: Min guard;With upper extremity assist;From chair/3-in-1;From bed Stand to Sit: 4: Min guard;With upper extremity assist;To bed;To chair/3-in-1 Details for Transfer Assistance: cues for  technique.     Exercises          End of Session OT - End of Session Equipment Utilized During Treatment: Gait belt;Rolling walker Activity Tolerance: Patient tolerated treatment well Patient left: in chair;with call bell/phone within reach;with family/visitor present  Lorri Frederick OTR/L [phone] 03/11/2013, 5:37 PM

## 2013-03-11 NOTE — Progress Notes (Signed)
Pt with 99.8 temperature. IS encouraged. Patient did well with IS and coughing. Reached 2500 on IS. Will continue to encourage.

## 2013-03-12 LAB — GLUCOSE, CAPILLARY
Glucose-Capillary: 106 mg/dL — ABNORMAL HIGH (ref 70–99)
Glucose-Capillary: 107 mg/dL — ABNORMAL HIGH (ref 70–99)

## 2013-03-12 NOTE — Progress Notes (Signed)
Subjective: Patient reports He is doing great he's ambulating in the halls well pain is improved although still significant in his low back. He was not ready for discharge  Objective: Vital signs in last 24 hours: Temp:  [98.1 F (36.7 C)-98.9 F (37.2 C)] 98.4 F (36.9 C) (11/09 0540) Pulse Rate:  [90-97] 97 (11/09 0540) Resp:  [20] 20 (11/09 0540) BP: (127-163)/(67-87) 163/87 mmHg (11/09 0540) SpO2:  [94 %-98 %] 96 % (11/09 0540)  Intake/Output from previous day: 11/08 0701 - 11/09 0700 In: 360 [P.O.:360] Out: 240 [Urine:200; Drains:40] Intake/Output this shift:    Strength out of 5 wound clean and dry  Lab Results: No results found for this basename: WBC, HGB, HCT, PLT,  in the last 72 hours BMET No results found for this basename: NA, K, CL, CO2, GLUCOSE, BUN, CREATININE, CALCIUM,  in the last 72 hours  Studies/Results: No results found.  Assessment/Plan: Patient appears to be ambulating well in the hallways continue to work on pain management and physical therapy today possible discharge tomorrow we'll  discontinue his Hemovac  LOS: 3 days     Kymberlee Viger P 03/12/2013, 8:55 AM

## 2013-03-12 NOTE — Progress Notes (Signed)
Physical Therapy Treatment Patient Details Name: Joseph Bird MRN: [id number] DOB: [birth year] Today's Date: 03/12/2013 Time: 4098-1191 PT Time Calculation (min): 18 min  PT Assessment / Plan / Recommendation  History of Present Illness 67yo male s/p PLIF L2-3 L3-4   PT Comments   Pt moves well just slow & very rigid posture.    Follow Up Recommendations  Home health PT;Supervision - Intermittent     Does the patient have the potential to tolerate intense rehabilitation     Barriers to Discharge        Equipment Recommendations  Rolling walker with 5" wheels    Recommendations for Other Services    Frequency Min 5X/week   Progress towards PT Goals Progress towards PT goals: Progressing toward goals  Plan Current plan remains appropriate    Precautions / Restrictions Precautions Precautions: Back Precaution Comments: reviewed back precautions Restrictions Weight Bearing Restrictions: No       Mobility  Bed Mobility Bed Mobility: Rolling Left;Left Sidelying to Sit;Sitting - Scoot to Edge of Bed Rolling Left: 5: Supervision Left Sidelying to Sit: 5: Supervision;HOB flat Details for Bed Mobility Assistance: Effortful as pt reaching for different things to help push himself to sitting upright.   Transfers Transfers: Sit to Stand;Stand to Sit Sit to Stand: 4: Min guard;With upper extremity assist;From bed Stand to Sit: 4: Min guard;With upper extremity assist;With armrests;To chair/3-in-1 Details for Transfer Assistance: Cues for hand placement Ambulation/Gait Ambulation/Gait Assistance: 5: Supervision Ambulation Distance (Feet): 300 Feet Assistive device: Rolling walker Ambulation/Gait Assistance Details: cues to relax UE's & to increase floor clearance.  Encouragement to increase gait speed as pt is very slow & rigid.   Gait Pattern: Step-through pattern;Decreased stride length;Shuffle Gait velocity: decreased  General Gait Details: slow, rigid, & cautious Stairs: No  (pt states he only has 1 step & he feels comfortable w/ step) Wheelchair Mobility Wheelchair Mobility: No      PT Goals (current goals can now be found in the care plan section) Acute Rehab PT Goals PT Goal Formulation: With patient/family Time For Goal Achievement: 03/24/13 Potential to Achieve Goals: Good  Visit Information  Last PT Received On: 03/12/13 Assistance Needed: +1 History of Present Illness: 67yo male s/p PLIF L2-3 L3-4    Subjective Data      Cognition  Cognition Arousal/Alertness: Awake/alert Behavior During Therapy: WFL for tasks assessed/performed Overall Cognitive Status: Within Functional Limits for tasks assessed    Balance     End of Session PT - End of Session Equipment Utilized During Treatment: Gait belt Activity Tolerance: Patient tolerated treatment well Patient left: in chair;with call bell/phone within reach;with family/visitor present Nurse Communication: Mobility status   GP     Joseph Bird 03/12/2013, 12:51 PM  Verdell Face, PTA [phone] 03/12/2013

## 2013-03-12 NOTE — Progress Notes (Signed)
Ambulated in hallway with RN this morning, tolerated well

## 2013-03-13 LAB — GLUCOSE, CAPILLARY
Glucose-Capillary: 139 mg/dL — ABNORMAL HIGH (ref 70–99)
Glucose-Capillary: 146 mg/dL — ABNORMAL HIGH (ref 70–99)
Glucose-Capillary: 152 mg/dL — ABNORMAL HIGH (ref 70–99)

## 2013-03-13 MED ORDER — HYDROMORPHONE HCL 2 MG PO TABS: 2.0000 mg | ORAL_TABLET | ORAL | Status: DC | PRN

## 2013-03-13 MED FILL — Heparin Sodium (Porcine) Inj 1000 Unit/ML: INTRAMUSCULAR | Qty: 30 | Status: AC

## 2013-03-13 MED FILL — Sodium Chloride Irrigation Soln 0.9%: Qty: 3000 | Status: AC

## 2013-03-13 MED FILL — Sodium Chloride IV Soln 0.9%: INTRAVENOUS | Qty: 1000 | Status: AC

## 2013-03-13 NOTE — Progress Notes (Signed)
Seen and agreed 03/13/2013 Robinette, Julia Elizabeth PTA [phone] pager [phone] office    

## 2013-03-13 NOTE — Progress Notes (Signed)
Physical Therapy Treatment Patient Details Name: Joseph Bird MRN: [id number] DOB: [birth year] Today's Date: 03/13/2013 Time: 1012-1037 PT Time Calculation (min): 25 min  PT Assessment / Plan / Recommendation  History of Present Illness 67yo male s/p PLIF L2-3 L3-4   PT Comments   Pt did well with gait and stair training today.  Pt required cues to maintain precautions with bathroom functional tasks.  Pt will benefit from [facility] PT to increase functional independence.   Follow Up Recommendations  Home health PT;Supervision - Intermittent     Does the patient have the potential to tolerate intense rehabilitation     Barriers to Discharge        Equipment Recommendations  Rolling walker with 5" wheels    Recommendations for Other Services    Frequency Min 5X/week   Progress towards PT Goals Progress towards PT goals: Progressing toward goals  Plan Current plan remains appropriate    Precautions / Restrictions Precautions Precautions: Back Precaution Comments: Recalled 2/3 precautions Restrictions Weight Bearing Restrictions: No   Pertinent Vitals/Pain Pt rated pain 3/10 pain; nursing present prior to tx to administer pain meds    Mobility  Bed Mobility Bed Mobility: Rolling Left;Left Sidelying to Sit;Sitting - Scoot to Edge of Bed Rolling Left: 6: Modified independent (Device/Increase time) Left Sidelying to Sit: 6: Modified independent (Device/Increase time) Sitting - Scoot to Edge of Bed: 6: Modified independent (Device/Increase time) Transfers Transfers: Sit to Stand;Stand to Sit Sit to Stand: 4: Min guard;With upper extremity assist;From bed;From chair/3-in-1 Stand to Sit: 4: Min guard;With upper extremity assist;With armrests;To chair/3-in-1 Ambulation/Gait Ambulation/Gait Assistance: 5: Supervision Ambulation Distance (Feet): 350 Feet Assistive device: Rolling walker Ambulation/Gait Assistance Details: Pt less rigid today and less UE use Gait Pattern: Step-through  pattern;Decreased stride length Gait velocity: decreased  Stairs: Yes Stairs Assistance: 4: Min assist Stairs Assistance Details (indicate cue type and reason): L HHA; wife and pt educated on safe technique Stair Management Technique: One rail Right Number of Stairs: 1 Wheelchair Mobility Wheelchair Mobility: No    Exercises     PT Diagnosis:    PT Problem List:   PT Treatment Interventions:     PT Goals (current goals can now be found in the care plan section)    Visit Information  Last PT Received On: 03/13/13 Assistance Needed: +1 History of Present Illness: 67yo male s/p PLIF L2-3 L3-4    Subjective Data      Cognition  Cognition Arousal/Alertness: Awake/alert Behavior During Therapy: WFL for tasks assessed/performed Overall Cognitive Status: Within Functional Limits for tasks assessed    Balance     End of Session PT - End of Session Equipment Utilized During Treatment: Gait belt Activity Tolerance: Patient tolerated treatment well Patient left: in chair;with call bell/phone within reach;with family/visitor present Nurse Communication: Mobility status   GP     Delrae Hagey, Irving Burton, SPTA 03/13/2013, 10:45 AM

## 2013-03-13 NOTE — Discharge Summary (Signed)
Physician Discharge Summary  Patient ID: Joseph Bird MRN: [id number] DOB/AGE: [birth year] 67 y.o.  Admit date: 03/09/2013 Discharge date: 03/13/2013  Admission Diagnoses: lumbar stenosis   Discharge Diagnoses: same   Discharged Condition: good  Hospital Course: The patient was admitted on 03/09/2013 and taken to the operating room where the patient underwent PLIF. The patient tolerated the procedure well and was taken to the recovery room and then to the floor in stable condition. The hospital course was routine. There were no complications. The wound remained clean dry and intact. Pt had appropriate back soreness. No complaints of leg pain or new N/T/W. The patient remained afebrile with stable vital signs, and tolerated a regular diet. The patient continued to increase activities, and pain was well controlled with oral pain medications.   Consults: None  Significant Diagnostic Studies:  Results for orders placed during the hospital encounter of 03/09/13  GLUCOSE, CAPILLARY      Result Value Range   Glucose-Capillary 100 (*) 70 - 99 mg/dL  GLUCOSE, CAPILLARY      Result Value Range   Glucose-Capillary 188 (*) 70 - 99 mg/dL  GLUCOSE, CAPILLARY      Result Value Range   Glucose-Capillary 177 (*) 70 - 99 mg/dL  GLUCOSE, CAPILLARY      Result Value Range   Glucose-Capillary 171 (*) 70 - 99 mg/dL  GLUCOSE, CAPILLARY      Result Value Range   Glucose-Capillary 138 (*) 70 - 99 mg/dL  GLUCOSE, CAPILLARY      Result Value Range   Glucose-Capillary 115 (*) 70 - 99 mg/dL  GLUCOSE, CAPILLARY      Result Value Range   Glucose-Capillary 127 (*) 70 - 99 mg/dL   Comment 1 Documented in Chart    GLUCOSE, CAPILLARY      Result Value Range   Glucose-Capillary 127 (*) 70 - 99 mg/dL  GLUCOSE, CAPILLARY      Result Value Range   Glucose-Capillary 111 (*) 70 - 99 mg/dL  GLUCOSE, CAPILLARY      Result Value Range   Glucose-Capillary 87  70 - 99 mg/dL   Comment 1 Notify RN     Comment 2  Documented in Chart    GLUCOSE, CAPILLARY      Result Value Range   Glucose-Capillary 140 (*) 70 - 99 mg/dL   Comment 1 Documented in Chart     Comment 2 Notify RN    GLUCOSE, CAPILLARY      Result Value Range   Glucose-Capillary 106 (*) 70 - 99 mg/dL   Comment 1 Documented in Chart     Comment 2 Notify RN    GLUCOSE, CAPILLARY      Result Value Range   Glucose-Capillary 147 (*) 70 - 99 mg/dL   Comment 1 Documented in Chart     Comment 2 Notify RN    GLUCOSE, CAPILLARY      Result Value Range   Glucose-Capillary 103 (*) 70 - 99 mg/dL  GLUCOSE, CAPILLARY      Result Value Range   Glucose-Capillary 177 (*) 70 - 99 mg/dL   Comment 1 Documented in Chart    GLUCOSE, CAPILLARY      Result Value Range   Glucose-Capillary 107 (*) 70 - 99 mg/dL  GLUCOSE, CAPILLARY      Result Value Range   Glucose-Capillary 144 (*) 70 - 99 mg/dL  GLUCOSE, CAPILLARY      Result Value Range   Glucose-Capillary 139 (*) 70 - 99  mg/dL  GLUCOSE, CAPILLARY      Result Value Range   Glucose-Capillary 146 (*) 70 - 99 mg/dL  GLUCOSE, CAPILLARY      Result Value Range   Glucose-Capillary 82  70 - 99 mg/dL  GLUCOSE, CAPILLARY      Result Value Range   Glucose-Capillary 152 (*) 70 - 99 mg/dL   Comment 1 Notify RN      Dg Lumbar Spine 2-3 Views  03/09/2013   CLINICAL DATA:  PLIF L2-L4 and removal of L4-S1 hardware  EXAM: LUMBAR SPINE - 2-3 VIEW; DG C-ARM 61-120 MIN  COMPARISON:  Lumbar spine CT -01/13/2013  FINDINGS: Two spot intraoperative radiographic images of the lower lumbar spine are provided for review. Spinal labeling is not possible secondary to exclusion of the lumbosacral junction.  Images demonstrate the sequela of 3 level paraspinal fusion and intervertebral disc space replacement. Additional radiopaque support apparatus is seen posterior to the operative site. No definite radiopaque foreign body.  IMPRESSION: Post 3 level paraspinal fusion and intervertebral disc space replacement as above.    Electronically Signed   By: Simonne Come M.D.   On: 03/09/2013 14:51   Dg C-arm 61-120 Min  03/09/2013   CLINICAL DATA:  PLIF L2-L4 and removal of L4-S1 hardware  EXAM: LUMBAR SPINE - 2-3 VIEW; DG C-ARM 61-120 MIN  COMPARISON:  Lumbar spine CT -01/13/2013  FINDINGS: Two spot intraoperative radiographic images of the lower lumbar spine are provided for review. Spinal labeling is not possible secondary to exclusion of the lumbosacral junction.  Images demonstrate the sequela of 3 level paraspinal fusion and intervertebral disc space replacement. Additional radiopaque support apparatus is seen posterior to the operative site. No definite radiopaque foreign body.  IMPRESSION: Post 3 level paraspinal fusion and intervertebral disc space replacement as above.   Electronically Signed   By: Simonne Come M.D.   On: 03/09/2013 14:51    Antibiotics:  Anti-infectives   Start     Dose/Rate Route Frequency Ordered Stop   03/09/13 1900  ceFAZolin (ANCEF) IVPB 1 g/50 mL premix     1 g 100 mL/hr over 30 Minutes Intravenous Every 8 hours 03/09/13 1546 03/10/13 0312   03/09/13 1055  ceFAZolin (ANCEF) 1-5 GM-% IVPB    Comments:  Jeralyn Ruths   : cabinet override      03/09/13 1055 03/09/13 2259   03/09/13 1054  ceFAZolin (ANCEF) 1-5 GM-% IVPB    Comments:  Jeralyn Ruths   : cabinet override      03/09/13 1054 03/09/13 2259   03/09/13 0817  bacitracin 50,000 Units in sodium chloride irrigation 0.9 % 500 mL irrigation  Status:  Discontinued       As needed 03/09/13 0818 03/09/13 1226   03/09/13 0600  ceFAZolin (ANCEF) IVPB 2 g/50 mL premix     2 g 100 mL/hr over 30 Minutes Intravenous On call to O.R. 03/08/13 1429 03/09/13 1145      Discharge Exam: Blood pressure 122/103, pulse 89, temperature 97.8 F (36.6 C), temperature source Oral, resp. rate 18, height 5\' 11"  (1.803 m), weight 95.255 kg (210 lb), SpO2 95.00%. Neurologic: Grossly normal incision ok  Discharge Medications:     Medication List          ALPRAZolam 0.5 MG 24 hr tablet  Commonly known as:  XANAX XR  Take 0.5 mg by mouth daily.     ALPRAZolam 1 MG 24 hr tablet  Commonly known as:  XANAX XR  Take 1  mg by mouth at bedtime.     calcium-vitamin D 500-200 MG-UNIT per tablet  Commonly known as:  OSCAL WITH D  Take 1 tablet by mouth daily with breakfast.     carvedilol 3.125 MG tablet  Commonly known as:  COREG  Take 3.125 mg by mouth 2 (two) times daily with a meal.     gabapentin 600 MG tablet  Commonly known as:  NEURONTIN  Take 600 mg by mouth 2 (two) times daily.     glipiZIDE 2.5 MG 24 hr tablet  Commonly known as:  GLUCOTROL XL  Take 2.5 mg by mouth 2 (two) times daily.     HYDROmorphone 2 MG tablet  Commonly known as:  DILAUDID  Take 1 tablet (2 mg total) by mouth every 4 (four) hours as needed for moderate pain.     lansoprazole 15 MG capsule  Commonly known as:  PREVACID  Take 15 mg by mouth daily.     lisinopril 20 MG tablet  Commonly known as:  PRINIVIL,ZESTRIL  Take 10 mg by mouth at bedtime.     metFORMIN 1000 MG tablet  Commonly known as:  GLUCOPHAGE  Take 1,000 mg by mouth 2 (two) times daily with a meal.     morphine 15 MG 12 hr tablet  Commonly known as:  MS CONTIN  Take 30 mg by mouth every 12 (twelve) hours. Verified with [facility]     ondansetron 4 MG tablet  Commonly known as:  ZOFRAN  Take 4 mg by mouth 3 (three) times daily.     pravastatin 10 MG tablet  Commonly known as:  PRAVACHOL  Take 10 mg by mouth at bedtime.     Vitamin D 2000 UNITS Caps  Take 2,000 Units by mouth daily.        Disposition: home   Final Dx: PLIF      Discharge Orders   Future Orders Complete By Expires   Call MD for:  difficulty breathing, headache or visual disturbances  As directed    Call MD for:  persistant nausea and vomiting  As directed    Call MD for:  redness, tenderness, or signs of infection (pain, swelling, redness, odor or green/yellow discharge around incision site)   As directed    Call MD for:  severe uncontrolled pain  As directed    Call MD for:  temperature >100.4  As directed    Diet - low sodium heart healthy  As directed    Discharge instructions  As directed    Comments:     No strenuous activity, no bending or twisting   Increase activity slowly  As directed       Follow-up Information   Follow up with JONES,DAVID S, MD. Schedule an appointment as soon as possible for a visit in 2 weeks.   Specialty:  Neurosurgery   Contact information:   [address] [place] [address] [phone]        Signed: Tia Alert 03/13/2013, 6:31 PM

## 2013-03-13 NOTE — Care Management Note (Unsigned)
    Page 1 of 1   03/13/2013     4:23:46 PM   CARE MANAGEMENT NOTE 03/13/2013  Patient:  Joseph Bird, Joseph Bird   Account Number:  [id number]  Date Initiated:  03/13/2013  Documentation initiated by:  Elmer Bales  Subjective/Objective Assessment:   Patient admitted for PLIF. Lives at home with wife     Action/Plan:   Will follow for discharge needs   Anticipated DC Date:  03/13/2013   Anticipated DC Plan:  HOME W HOME HEALTH SERVICES      DC Planning Services  CM consult      Choice offered to / List presented to:          [facility] arranged  HH-2 PT  HH-3 OT      [facility] agency  [facility]   Status of service:  Completed, signed off Medicare Important Message given?   (If response is "NO", the following Medicare IM given date fields will be blank) Date Medicare IM given:   Date Additional Medicare IM given:    Discharge Disposition:    Per UR Regulation:  Reviewed for med. necessity/level of care/duration of stay  If discussed at Long Length of Stay Meetings, dates discussed:    Comments:  03/13/13 1600 Elmer Bales RN, MSN, CM- Met with patient and wife, who have requested [facility] health.  Joseph Bird with [facility] was notified and accepted the referral.  Dr Yetta Barre was notified that a face-to-face is needed.  Patient's primary contact is wife Joseph Bird at [phone].  Patient states he already has a walker and 3n1 at home.

## 2013-03-15 ENCOUNTER — Encounter (HOSPITAL_COMMUNITY): Payer: Self-pay | Admitting: Neurological Surgery

## 2013-10-04 ENCOUNTER — Other Ambulatory Visit: Payer: Self-pay | Admitting: Internal Medicine

## 2013-10-04 DIAGNOSIS — K769 Liver disease, unspecified: Secondary | ICD-10-CM

## 2013-10-11 ENCOUNTER — Other Ambulatory Visit: Payer: Self-pay | Admitting: Internal Medicine

## 2013-10-11 DIAGNOSIS — K769 Liver disease, unspecified: Secondary | ICD-10-CM

## 2013-10-13 ENCOUNTER — Ambulatory Visit
Admission: RE | Admit: 2013-10-13 | Discharge: 2013-10-13 | Disposition: A | Discharge status: Home / Self Care | Payer: Medicare Other | Source: Ambulatory Visit | Attending: Internal Medicine | Admitting: Internal Medicine

## 2013-10-13 ENCOUNTER — Inpatient Hospital Stay: Admission: RE | Admit: 2013-10-13 | Payer: Self-pay | Source: Ambulatory Visit

## 2013-10-13 DIAGNOSIS — K769 Liver disease, unspecified: Secondary | ICD-10-CM

## 2013-10-13 MED ORDER — GADOBENATE DIMEGLUMINE 529 MG/ML IV SOLN
18.0000 mL | Freq: Once | INTRAVENOUS | Status: AC | PRN
  Administered 2013-10-13: 18 mL via INTRAVENOUS

## 2015-02-07 ENCOUNTER — Other Ambulatory Visit: Payer: Self-pay | Admitting: Internal Medicine

## 2015-02-07 DIAGNOSIS — K8689 Other specified diseases of pancreas: Secondary | ICD-10-CM

## 2015-02-23 ENCOUNTER — Inpatient Hospital Stay
Admission: RE | Admit: 2015-02-23 | Discharge: 2015-02-23 | Disposition: A | Discharge status: Home / Self Care | Payer: Medicare Other | Source: Ambulatory Visit | Attending: Internal Medicine | Admitting: Internal Medicine

## 2016-03-20 ENCOUNTER — Other Ambulatory Visit: Payer: Self-pay | Admitting: Urology

## 2016-03-20 DIAGNOSIS — D49511 Neoplasm of unspecified behavior of right kidney: Secondary | ICD-10-CM

## 2016-04-14 ENCOUNTER — Ambulatory Visit
Admission: RE | Admit: 2016-04-14 | Discharge: 2016-04-14 | Disposition: A | Discharge status: Home / Self Care | Payer: Medicare Other | Source: Ambulatory Visit | Attending: Urology | Admitting: Urology

## 2016-04-14 DIAGNOSIS — D49511 Neoplasm of unspecified behavior of right kidney: Secondary | ICD-10-CM

## 2016-04-14 MED ORDER — GADOBENATE DIMEGLUMINE 529 MG/ML IV SOLN
20.0000 mL | Freq: Once | INTRAVENOUS | Status: AC | PRN
  Administered 2016-04-14: 20 mL via INTRAVENOUS

## 2016-04-22 ENCOUNTER — Other Ambulatory Visit: Payer: Self-pay | Admitting: Urology

## 2016-05-18 NOTE — Patient Instructions (Addendum)
CARLIE FRANCHINA  05/18/2016   Your procedure is scheduled on: 05/21/2016    Report to [facility]  Entrance take [place]  elevators to 3rd floor to  [place] at   [place] AM.  Call this number if you have problems the morning of surgery [phone]   Remember: ONLY 1 PERSON MAY GO WITH YOU TO SHORT STAY TO GET  READY MORNING OF YOUR SURGERY.  Do not eat food or drink liquids :After Midnight.             Clear liquid diet the day before surgery.               Magnesium Citrate - one bottle by 12 noon the day before surgery.               Fleets enema nite before surgery.      Take these medicines the morning of surgery with A SIP OF WATER: Carvedilol ( coreg), Dilaudid if needed, Prevacid, MS Contin  DO NOT TAKE ANY DIABETIC MEDICATIONS DAY OF YOUR SURGERY                               You may not have any metal on your body including hair pins and              piercings  Do not wear jewelry, lotions, powders or perfumes, deodorant              Men may shave face and neck.   Do not bring valuables to the hospital. [place].  Contacts, dentures or bridgework may not be worn into surgery.  Leave suitcase in the car. After surgery it may be brought to your room.       Special Instructions: N/A              Please read over the following fact sheets you were given: _____________________________________________________________________             [facility] - Preparing for Surgery Before surgery, you can play an important role.  Because skin is not sterile, your skin needs to be as free of germs as possible.  You can reduce the number of germs on your skin by washing with CHG (chlorahexidine gluconate) soap before surgery.  CHG is an antiseptic cleaner which kills germs and bonds with the skin to continue killing germs even after washing. Please DO NOT use if you have an allergy to CHG or antibacterial  soaps.  If your skin becomes reddened/irritated stop using the CHG and inform your nurse when you arrive at Short Stay. Do not shave (including legs and underarms) for at least 48 hours prior to the first CHG shower.  You may shave your face/neck. Please follow these instructions carefully:  1.  Shower with CHG Soap the night before surgery and the  morning of Surgery.  2.  If you choose to wash your hair, wash your hair first as usual with your  normal  shampoo.  3.  After you shampoo, rinse your hair and body thoroughly to remove the  shampoo.  4.  Use CHG as you would any other liquid soap.  You can apply chg directly  to the skin and wash                       Gently with a scrungie or clean washcloth.  5.  Apply the CHG Soap to your body ONLY FROM THE NECK DOWN.   Do not use on face/ open                           Wound or open sores. Avoid contact with eyes, ears mouth and genitals (private parts).                       Wash face,  Genitals (private parts) with your normal soap.             6.  Wash thoroughly, paying special attention to the area where your surgery  will be performed.  7.  Thoroughly rinse your body with warm water from the neck down.  8.  DO NOT shower/wash with your normal soap after using and rinsing off  the CHG Soap.                9.  Pat yourself dry with a clean towel.            10.  Wear clean pajamas.            11.  Place clean sheets on your bed the night of your first shower and do not  sleep with pets. Day of Surgery : Do not apply any lotions/deodorants the morning of surgery.  Please wear clean clothes to the hospital/surgery center.  FAILURE TO FOLLOW THESE INSTRUCTIONS MAY RESULT IN THE CANCELLATION OF YOUR SURGERY PATIENT SIGNATURE_________________________________  NURSE SIGNATURE__________________________________  ________________________________________________________________________    CLEAR LIQUID DIET   Foods  Allowed                                                                     Foods Excluded  Coffee and tea, regular and decaf                             liquids that you cannot  Plain Jell-O in any flavor                                             see through such as: Fruit ices (not with fruit pulp)                                     milk, soups, orange juice  Iced Popsicles                                    All solid food Carbonated beverages, regular and diet  Cranberry, grape and apple juices Sports drinks like Gatorade Lightly seasoned clear broth or consume(fat free) Sugar, honey syrup  Sample Menu Breakfast                                Lunch                                     Supper Cranberry juice                    Beef broth                            Chicken broth Jell-O                                     Grape juice                           Apple juice Coffee or tea                        Jell-O                                      Popsicle                                                Coffee or tea                        Coffee or tea  _____________________________________________________________________   WHAT IS A BLOOD TRANSFUSION? Blood Transfusion Information  A transfusion is the replacement of blood or some of its parts. Blood is made up of multiple cells which provide different functions.  Red blood cells carry oxygen and are used for blood loss replacement.  White blood cells fight against infection.  Platelets control bleeding.  Plasma helps clot blood.  Other blood products are available for specialized needs, such as hemophilia or other clotting disorders. BEFORE THE TRANSFUSION  Who gives blood for transfusions?   Healthy volunteers who are fully evaluated to make sure their blood is safe. This is blood bank blood. Transfusion therapy is the safest it has ever been in the practice of medicine. Before blood is  taken from a donor, a complete history is taken to make sure that person has no history of diseases nor engages in risky social behavior (examples are intravenous drug use or sexual activity with multiple partners). The donor's travel history is screened to minimize risk of transmitting infections, such as malaria. The donated blood is tested for signs of infectious diseases, such as HIV and hepatitis. The blood is then tested to be sure it is compatible with you in order to minimize the chance of a transfusion reaction. If you or a relative donates blood, this is often done in anticipation of surgery and is not appropriate for emergency situations. It takes many days to process the donated blood.  RISKS AND COMPLICATIONS Although transfusion therapy is very safe and saves many lives, the main dangers of transfusion include:   Getting an infectious disease.  Developing a transfusion reaction. This is an allergic reaction to something in the blood you were given. Every precaution is taken to prevent this. The decision to have a blood transfusion has been considered carefully by your caregiver before blood is given. Blood is not given unless the benefits outweigh the risks. AFTER THE TRANSFUSION  Right after receiving a blood transfusion, you will usually feel much better and more energetic. This is especially true if your red blood cells have gotten low (anemic). The transfusion raises the level of the red blood cells which carry oxygen, and this usually causes an energy increase.  The nurse administering the transfusion will monitor you carefully for complications. HOME CARE INSTRUCTIONS  No special instructions are needed after a transfusion. You may find your energy is better. Speak with your caregiver about any limitations on activity for underlying diseases you may have. SEEK MEDICAL CARE IF:   Your condition is not improving after your transfusion.  You develop redness or irritation at the  intravenous (IV) site. SEEK IMMEDIATE MEDICAL CARE IF:  Any of the following symptoms occur over the next 12 hours:  Shaking chills.  You have a temperature by mouth above 102 F (38.9 C), not controlled by medicine.  Chest, back, or muscle pain.  People around you feel you are not acting correctly or are confused.  Shortness of breath or difficulty breathing.  Dizziness and fainting.  You get a rash or develop hives.  You have a decrease in urine output.  Your urine turns a dark color or changes to pink, red, or brown. Any of the following symptoms occur over the next 10 days:  You have a temperature by mouth above 102 F (38.9 C), not controlled by medicine.  Shortness of breath.  Weakness after normal activity.  The white part of the eye turns yellow (jaundice).  You have a decrease in the amount of urine or are urinating less often.  Your urine turns a dark color or changes to pink, red, or brown. Document Released: 04/17/2000 Document Revised: 07/13/2011 Document Reviewed: 12/05/2007 ExitCare Patient Information 2014 [place].  _______________________________________________________________________  Incentive Spirometer  An incentive spirometer is a tool that can help keep your lungs clear and active. This tool measures how well you are filling your lungs with each breath. Taking long deep breaths may help reverse or decrease the chance of developing breathing (pulmonary) problems (especially infection) following:  A long period of time when you are unable to move or be active. BEFORE THE PROCEDURE   If the spirometer includes an indicator to show your best effort, your nurse or respiratory therapist will set it to a desired goal.  If possible, sit up straight or lean slightly forward. Try not to slouch.  Hold the incentive spirometer in an upright position. INSTRUCTIONS FOR USE  1. Sit on the edge of your bed if possible, or sit up as far as you can  in bed or on a chair. 2. Hold the incentive spirometer in an upright position. 3. Breathe out normally. 4. Place the mouthpiece in your mouth and seal your lips tightly around it. 5. Breathe in slowly and as deeply as possible, raising the piston or the ball toward the top of the column. 6. Hold your breath for 3-5 seconds or for as long as possible. Allow the piston  or ball to fall to the bottom of the column. 7. Remove the mouthpiece from your mouth and breathe out normally. 8. Rest for a few seconds and repeat Steps 1 through 7 at least 10 times every 1-2 hours when you are awake. Take your time and take a few normal breaths between deep breaths. 9. The spirometer may include an indicator to show your best effort. Use the indicator as a goal to work toward during each repetition. 10. After each set of 10 deep breaths, practice coughing to be sure your lungs are clear. If you have an incision (the cut made at the time of surgery), support your incision when coughing by placing a pillow or rolled up towels firmly against it. Once you are able to get out of bed, walk around indoors and cough well. You may stop using the incentive spirometer when instructed by your caregiver.  RISKS AND COMPLICATIONS  Take your time so you do not get dizzy or light-headed.  If you are in pain, you may need to take or ask for pain medication before doing incentive spirometry. It is harder to take a deep breath if you are having pain. AFTER USE  Rest and breathe slowly and easily.  It can be helpful to keep track of a log of your progress. Your caregiver can provide you with a simple table to help with this. If you are using the spirometer at home, follow these instructions: [place] IF:   You are having difficultly using the spirometer.  You have trouble using the spirometer as often as instructed.  Your pain medication is not giving enough relief while using the spirometer.  You develop fever of  100.5 F (38.1 C) or higher. SEEK IMMEDIATE MEDICAL CARE IF:   You cough up bloody sputum that had not been present before.  You develop fever of 102 F (38.9 C) or greater.  You develop worsening pain at or near the incision site. MAKE SURE YOU:   Understand these instructions.  Will watch your condition.  Will get help right away if you are not doing well or get worse. Document Released: 08/31/2006 Document Revised: 07/13/2011 Document Reviewed: 11/01/2006 ExitCare Patient Information 2014 ExitCare, [place].   ________________________________________________________________________ How to Manage Your Diabetes Before and After Surgery  Why is it important to control my blood sugar before and after surgery? . Improving blood sugar levels before and after surgery helps healing and can limit problems. . A way of improving blood sugar control is eating a healthy diet by: o  Eating less sugar and carbohydrates o  Increasing activity/exercise o  Talking with your doctor about reaching your blood sugar goals . High blood sugars (greater than 180 mg/dL) can raise your risk of infections and slow your recovery, so you will need to focus on controlling your diabetes during the weeks before surgery. . Make sure that the doctor who takes care of your diabetes knows about your planned surgery including the date and location.  How do I manage my blood sugar before surgery? . Check your blood sugar at least 4 times a day, starting 2 days before surgery, to make sure that the level is not too high or low. o Check your blood sugar the morning of your surgery when you wake up and every 2 hours until you get to the Short Stay unit. . If your blood sugar is less than 70 mg/dL, you will need to treat for low blood sugar: o Do not  take insulin. o Treat a low blood sugar (less than 70 mg/dL) with  cup of clear juice (cranberry or apple), 4 glucose tablets, OR glucose gel. o Recheck blood sugar in 15  minutes after treatment (to make sure it is greater than 70 mg/dL). If your blood sugar is not greater than 70 mg/dL on recheck, call [phone] for further instructions. . Report your blood sugar to the short stay nurse when you get to Short Stay.  . If you are admitted to the hospital after surgery: o Your blood sugar will be checked by the staff and you will probably be given insulin after surgery (instead of oral diabetes medicines) to make sure you have good blood sugar levels. o The goal for blood sugar control after surgery is 80-180 mg/dL.   WHAT DO I DO ABOUT MY DIABETES MEDICATION?  Marland Kitchen Do not take oral diabetes medicines (pills) the morning of surgery.    Patient Signature:  Date:   Nurse Signature:  Date:   Reviewed and Endorsed by [facility] Patient Education Committee, August 2015

## 2016-05-19 NOTE — H&P (Signed)
Office Visit Report     04/21/2016   --------------------------------------------------------------------------------   Steele Sizer  [id number]  PRIMARY CARE:  Celedonio Miyamoto, MD  DOB: [birth year], 71 year old Male  REFERRING:  Raynelle Bring, MD  SSN: [id number]  PROVIDER:  Raynelle Bring, M.D.    LOCATION:  [facility] [phone]   --------------------------------------------------------------------------------   CC/HPI: Prostate cancer and right renal neoplasm   Mr. Vanduzer follows up today for surveillance of his prostate cancer. He denies any new voiding complaints. He continues to utilize his penile prosthesis without difficulty.   He also follows up today for further evaluation of his right renal neoplasm found incidentally on imaging at the [place]. He underwent a repeat MRI prior to today's visit. I independently reviewed his MRI which indicates a stable 1.2 cm lesion off the anterior interpolar region of the right kidney. This is mostly endophytic although partially exophytic. This is mostly cystic but does have some enhancing components consistent with a very small Bosniak 4 renal cyst. No regional lymphadenopathy is noted. No other concerning renal lesions. 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 No pancreatic masses noted on today's imaging. He remains asymptomatic.   He does apparently have a history of congestive heart failure that is new since his prior surgery. He recently had an echocardiogram and does have cardiology follow-up scheduled with Dr. Ballard Russell in January. He currently is taking carvedilol 12.5 mg.     ALLERGIES: None   MEDICATIONS: ALPRAZolam XR 0.5 MG Oral Tablet Extended Release 24 Hour Oral  AndroGel Pump 20.25 MG/ACT (1.62%) Transdermal Gel 0 Transdermal  Atorvastatin Calcium 20 MG Oral Tablet Oral  GlipiZIDE 5 MG Oral Tablet Oral  Lansoprazole 30 MG Oral Capsule Delayed Release Oral  Lisinopril 20 MG Oral  Tablet Oral  Mebendazole 100 MG CHEW Oral  MetFORMIN HCl - 1000 MG Oral Tablet Oral  Metoclopramide HCl - 10 MG Oral Tablet Oral  Morphine Sulfate ER 15 MG Oral Tablet Extended Release Oral  Multi-Vitamin TABS Oral  Omeprazole 40 MG Oral Capsule Delayed Release Oral  Ondansetron HCl - 4 MG Oral Tablet Oral  Sertraline HCl - 50 MG Oral Tablet Oral  Sildenafil Citrate 20 MG Oral Tablet 0 Oral  Temazepam 15 MG Oral Capsule Oral  Travatan Z 0.004 % Ophthalmic Solution Ophthalmic  Viagra 100 MG Oral Tablet 0 Oral     GU PSH: Cystoscopy TURP - 2011      PSH Notes: Eye Surgery, Cholecystectomy Laparoscopic, Prostatectomy Robotic-Assisted, Surg Penis Inflatable Penile Prosthesis, Transurethral Resection Of Prostate (TURP), Back Surgery, Back Surgery   NON-GU PSH: Insert Self-contd Prosthesis - 2011 Laparoscopic Cholecystectomy - 2011    GU PMH: Neoplasm of unspecified behavior of right kidney - 01/17/2016 ED, arterial insufficiency, Erectile dysfunction due to arterial insufficiency - 04/13/2015 Prostate Cancer, Prostate cancer - 04/13/2015 Testicular hypofunction, Hypogonadism, testicular - 04/13/2015 Urinary Frequency, Increased urinary frequency - 2014 Urinary Urgency, Urinary urgency - 2014      PMH Notes:   1) Prostate cancer: He is s/p a robotic prostatectomy on 06/27/09. His PSA has been undetectable since surgery.   TNM stage: pT2c Nx Mx  Gleason score: 3+4=7  Surgical margins: Positive (focal - right and left apex)  Pretreatment PSA: 4.12   2) Erectile dysfunction: He did have erectile dysfunction prior to his radical prostatectomy and had a pre-existing IPP in place prior to his prostatectomy.   3) Testosterone deficiency: He developed the acute  onset of severe hot flashes in December 2013. He also noted increasing fatigue and decreased libido. His baseline testosterone was 224 ng/dl. He did have pituitary lab studies performed due to the severity of his symptoms and the  fact they occurred so rapidly. He was treated with Androgel but then discontinued therapy in the spring of 2106.   4) Right renal neoplasm: This was incidentally noted on imaging at the [place]. He presented for evaluation with me regarding this finding in September 2017.     NON-GU PMH: Other fatigue, Fatigue - 2016 Flushing, Flushing - 2014 Gout, Gout - 2014 Personal history of other diseases of the circulatory system, History of hypertension - 2014 Personal history of other diseases of the digestive system, History of esophageal reflux - 2014 Personal history of other endocrine, nutritional and metabolic disease, History of diabetes mellitus - 2014, History of hypercholesterolemia, - 2014    FAMILY HISTORY: cardiac disorder - Runs In Family Heart Disease - Runs In Family Prostate Cancer - No Family History   SOCIAL HISTORY: Marital Status: Married Current Smoking Status: Patient has never smoked.  Has never drank.  Does not drink caffeine.     Notes: Former smoker, Occupation:, Alcohol Use, Marital History - Currently Married, Tobacco Use   REVIEW OF SYSTEMS:    GU Review Male:   Patient reports frequent urination, get up at night to urinate, and leakage of urine. Patient denies hard to postpone urination, burning/ pain with urination, stream starts and stops, trouble starting your streams, and have to strain to urinate .  Gastrointestinal (Upper):   Patient reports nausea. Patient denies vomiting.  Gastrointestinal (Lower):   Patient denies diarrhea and constipation.  Constitutional:   Patient denies fever, night sweats, weight loss, and fatigue.  Skin:   Patient denies skin rash/ lesion and itching.  Eyes:   Patient denies blurred vision and double vision.  Ears/ Nose/ Throat:   Patient denies sore throat and sinus problems.  Hematologic/Lymphatic:   Patient reports easy bruising. Patient denies swollen glands.  Cardiovascular:   Patient denies leg swelling and chest pains.   Respiratory:   Patient denies cough and shortness of breath.  Endocrine:   Patient denies excessive thirst.  Musculoskeletal:   Patient reports back pain and joint pain.   Neurological:   Patient denies headaches and dizziness.  Psychologic:   Patient reports anxiety. Patient denies depression.   VITAL SIGNS:      04/21/2016 02:10 PM  Weight 200 lb / 90.72 kg  Height 72 in / 182.88 cm  BP 113/76 mmHg  Pulse 74 /min  BMI 27.1 kg/m   MULTI-SYSTEM PHYSICAL EXAMINATION:    Constitutional: Well-nourished. No physical deformities. Normally developed. Good grooming.  Neck: Neck symmetrical, not swollen. Normal tracheal position.  Respiratory: No labored breathing, no use of accessory muscles.   Cardiovascular: Normal temperature, normal extremity pulses, no swelling, no varicosities. He does have an irregular heart rate with periodic premature beats consistent with probable PVCs.  Lymphatic: No enlargement of neck, axillae, groin.  Skin: No paleness, no jaundice, no cyanosis. No lesion, no ulcer, no rash.  Neurologic / Psychiatric: Oriented to time, oriented to place, oriented to person. No depression, no anxiety, no agitation.  Gastrointestinal: No mass, no tenderness, no rigidity, non obese abdomen.  Eyes: Normal conjunctivae. Normal eyelids.  Ears, Nose, Mouth, and Throat: Left ear no scars, no lesions, no masses. Right ear no scars, no lesions, no masses. Nose no scars, no lesions, no masses. Normal  hearing. Normal lips.  Musculoskeletal: Normal gait and station of head and neck.     PAST DATA REVIEWED:  Source Of History:  Patient  Urine Test Review:   Urinalysis  X-Ray Review: MRI Abdomen: Reviewed Films.     04/07/16 04/13/15 09/01/14 02/22/14 08/21/13 12/08/12 07/13/12 01/13/12  PSA  Total PSA 0.018  <0.01  <0.01  0.02  0.01  0.01  0.01  0.01   Free PSA       <0.1    % Free PSA       100      04/13/15 09/01/14 02/22/14 08/21/13 12/08/12 09/27/12 07/20/12 06/22/12  Hormones   Testosterone, Total 176  105  209  413  267  421  211  224     PROCEDURES:          Urinalysis Dipstick Dipstick Cont'd  Color: Amber Bilirubin: Neg  Appearance: Clear Ketones: Trace  Specific Gravity: 1.025 Blood: Neg  pH: <=5.0 Protein: Neg  Glucose: Neg Urobilinogen: 1.0    Nitrites: Neg    Leukocyte Esterase: Neg    ASSESSMENT:      ICD-10 Details  1 GU:   Prostate Cancer - C61   2   Neoplasm of unspecified behavior of right kidney - D49.511    PLAN:           Schedule Return Visit/Planned Activity: Other See Visit Notes             Note: Will call to schedule surger          Document Letter(s):  Created for Patient: Clinical Summary         Notes:   1. Prostate cancer: His PSA remains undetectable. This will be rechecked in one year.   2. Right renal neoplasm: This mass is concerning for a Bosniak 4 renal cyst. We reviewed the likelihood that this may represent a malignancy. Mr. Merrell adamantly wishes to proceed with definitive curative therapy. We did review all available options but is most interested in nephron sparing surgery.   The patient was provided information regarding their renal mass including the relative risk of benign versus malignant pathology and the natural history of renal cell carcinoma and other possible malignancies of the kidney. The role of renal biopsy, laboratory testing, and imaging studies to further characterize renal masses and/or the presence of metastatic disease were explained. We discussed the role of active surveillance, surgical therapy with both radical nephrectomy and nephron-sparing surgery, and ablative therapy in the treatment of renal masses. In addition, we discussed our goals of providing an accurate diagnosis and oncologic control while maintaining optimal renal function as appropriate based on the size, location, and complexity of their renal mass as well as their co-morbidities.   We have discussed the risks of treatment in  detail including but not limited to bleeding, infection, heart attack, stroke, death, venothromoboembolism, cancer recurrence, injury/damage to surrounding organs and structures, urine leak, the possibility of open surgical conversion for patients undergoing minimally invasive surgery, the risk of developing chronic kidney disease and its associated implications, and the potential risk of end stage renal disease possibly necessitating dialysis.   My tentative plan would be to perform a right robot-assisted laparoscopic partial nephrectomy. However, he does have an appointment with his cardiologist in the near future and I would like to better understand his cardiac risk. We will therefore request Dr. Ballard Russell to provide [place] information regarding his cardiac risk assessment for major noncardiac surgery.   Cc:  Dr. Celedonio Miyamoto  Dr. Ballard Russell    * Signed by Raynelle Bring, M.D. on 04/21/16 at 8:39 PM (EST)*

## 2016-05-20 ENCOUNTER — Ambulatory Visit (HOSPITAL_COMMUNITY)
Admission: RE | Admit: 2016-05-20 | Discharge: 2016-05-20 | Disposition: A | Discharge status: Home / Self Care | Payer: Medicare Other | Source: Ambulatory Visit | Attending: Urology | Admitting: Urology

## 2016-05-20 ENCOUNTER — Encounter (HOSPITAL_COMMUNITY): Payer: Self-pay | Admitting: Anesthesiology

## 2016-05-20 ENCOUNTER — Encounter (HOSPITAL_COMMUNITY)
Admission: RE | Admit: 2016-05-20 | Discharge: 2016-05-20 | Disposition: A | Discharge status: Home / Self Care | Payer: Medicare Other | Source: Ambulatory Visit | Attending: Urology | Admitting: Urology

## 2016-05-20 ENCOUNTER — Encounter (HOSPITAL_COMMUNITY): Payer: Self-pay

## 2016-05-20 DIAGNOSIS — Z0183 Encounter for blood typing: Secondary | ICD-10-CM

## 2016-05-20 DIAGNOSIS — Z01818 Encounter for other preprocedural examination: Secondary | ICD-10-CM | POA: Insufficient documentation

## 2016-05-20 DIAGNOSIS — Z01812 Encounter for preprocedural laboratory examination: Secondary | ICD-10-CM

## 2016-05-20 DIAGNOSIS — D49511 Neoplasm of unspecified behavior of right kidney: Secondary | ICD-10-CM

## 2016-05-20 HISTORY — DX: Cardiac arrhythmia, unspecified: I49.9

## 2016-05-20 HISTORY — DX: Depression, unspecified: F32.A

## 2016-05-20 HISTORY — DX: Heart failure, unspecified: I50.9

## 2016-05-20 HISTORY — DX: Personal history of urinary calculi: Z87.442

## 2016-05-20 HISTORY — DX: Major depressive disorder, single episode, unspecified: F32.9

## 2016-05-20 HISTORY — DX: Unspecified osteoarthritis, unspecified site: M19.90

## 2016-05-20 HISTORY — DX: Bell's palsy: G51.0

## 2016-05-20 LAB — CBC
HCT: 41.6 % (ref 39.0–52.0)
Hemoglobin: 14.3 g/dL (ref 13.0–17.0)
MCH: 31.9 pg (ref 26.0–34.0)
MCHC: 34.4 g/dL (ref 30.0–36.0)
MCV: 92.9 fL (ref 78.0–100.0)
PLATELETS: 215 10*3/uL (ref 150–400)
RBC: 4.48 MIL/uL (ref 4.22–5.81)
RDW: 12.8 % (ref 11.5–15.5)
WBC: 8.4 10*3/uL (ref 4.0–10.5)

## 2016-05-20 LAB — COMPREHENSIVE METABOLIC PANEL
ALBUMIN: 4.1 g/dL (ref 3.5–5.0)
ALT: 19 U/L (ref 17–63)
AST: 21 U/L (ref 15–41)
Alkaline Phosphatase: 59 U/L (ref 38–126)
Anion gap: 7 (ref 5–15)
BUN: 17 mg/dL (ref 6–20)
CHLORIDE: 102 mmol/L (ref 101–111)
CO2: 27 mmol/L (ref 22–32)
Calcium: 9.3 mg/dL (ref 8.9–10.3)
Creatinine, Ser: 0.85 mg/dL (ref 0.61–1.24)
GFR calc Af Amer: >60 mL/min (ref >60)
Glucose, Bld: 153 mg/dL — ABNORMAL HIGH (ref 65–99)
POTASSIUM: 4.2 mmol/L (ref 3.5–5.1)
SODIUM: 136 mmol/L (ref 135–145)
Total Bilirubin: 0.9 mg/dL (ref 0.3–1.2)
Total Protein: 6.6 g/dL (ref 6.5–8.1)

## 2016-05-20 LAB — GLUCOSE, CAPILLARY: Glucose-Capillary: 159 mg/dL — ABNORMAL HIGH (ref 65–99)

## 2016-05-20 NOTE — Progress Notes (Signed)
EKG-05/08/16- on chart  05/06/2016- Clearance in LOV note from Dr Geraldo Pitter on chart  10/02/15- Stress Echo on chart  ECHO- 04/16/16- on chart

## 2016-05-20 NOTE — Progress Notes (Signed)
Wife instructed at preop appointment to check with DR Milly Jakob regarding diabetic medications for 05/20/16 due to clear liquids and bowel prep.  Wife voiced understanding.

## 2016-05-20 NOTE — Anesthesia Preprocedure Evaluation (Addendum)
Anesthesia Evaluation  Patient identified by MRN, date of birth, ID band Patient awake    Reviewed: Allergy & Precautions, NPO status , Patient's Chart, lab work & pertinent test results  History of Anesthesia Complications (+) PONV, Emergence Delirium and history of anesthetic complications  Airway Mallampati: II  TM Distance: >3 FB Neck ROM: Full    Dental no notable dental hx. (+) Teeth Intact   Pulmonary pneumonia, resolved,    Pulmonary exam normal breath sounds clear to auscultation       Cardiovascular hypertension, Pt. on medications and Pt. on home beta blockers +CHF  Normal cardiovascular exam Rhythm:Regular Rate:Normal     Neuro/Psych PSYCHIATRIC DISORDERS Anxiety Depression Diabetic peripheral neuropathy Hx/o Bell's palsy  Neuromuscular disease    GI/Hepatic Neg liver ROS, GERD  Medicated and Controlled,  Endo/Other  diabetes, Poorly Controlled, Type 2, Oral Hypoglycemic AgentsHyperlipidemia Obesity  Renal/GU Right renal neoplasm  negative genitourinary   Musculoskeletal  (+) Arthritis , Osteoarthritis,    Abdominal   Peds  Hematology negative hematology ROS (+)   Anesthesia Other Findings   Reproductive/Obstetrics                            Lab Results  Component Value Date   WBC 8.4 05/20/2016   HGB 14.3 05/20/2016   HCT 41.6 05/20/2016   MCV 92.9 05/20/2016   PLT 215 05/20/2016     Chemistry      Component Value Date/Time   NA 136 05/20/2016 1029   K 4.2 05/20/2016 1029   CL 102 05/20/2016 1029   CO2 27 05/20/2016 1029   BUN 17 05/20/2016 1029   CREATININE 0.85 05/20/2016 1029      Component Value Date/Time   CALCIUM 9.3 05/20/2016 1029   ALKPHOS 59 05/20/2016 1029   AST 21 05/20/2016 1029   ALT 19 05/20/2016 1029   BILITOT 0.9 05/20/2016 1029     EKG: normal sinus rhythm, frequent PVC's noted, LAD.  Anesthesia Physical Anesthesia Plan  ASA:  II  Anesthesia Plan: General   Post-op Pain Management:    Induction: Intravenous  Airway Management Planned: Oral ETT  Additional Equipment:   Intra-op Plan:   Post-operative Plan: Extubation in OR  Informed Consent: I have reviewed the patients History and Physical, chart, labs and discussed the procedure including the risks, benefits and alternatives for the proposed anesthesia with the patient or authorized representative who has indicated his/her understanding and acceptance.   Dental advisory given  Plan Discussed with: CRNA, Anesthesiologist and Surgeon  Anesthesia Plan Comments:         Anesthesia Quick Evaluation

## 2016-05-21 ENCOUNTER — Encounter (HOSPITAL_COMMUNITY): Payer: Self-pay | Admitting: *Deleted

## 2016-05-21 ENCOUNTER — Inpatient Hospital Stay (HOSPITAL_COMMUNITY)
Admission: RE | Admit: 2016-05-21 | Discharge: 2016-05-23 | DRG: 658 | Disposition: A | Discharge status: Home / Self Care | Payer: Medicare Other | Source: Ambulatory Visit | Attending: Urology | Admitting: Urology

## 2016-05-21 ENCOUNTER — Inpatient Hospital Stay (HOSPITAL_COMMUNITY): Payer: Medicare Other | Admitting: Anesthesiology

## 2016-05-21 ENCOUNTER — Encounter (HOSPITAL_COMMUNITY)
Admission: RE | Disposition: A | Discharge status: Home / Self Care | Payer: Self-pay | Source: Ambulatory Visit | Attending: Urology

## 2016-05-21 DIAGNOSIS — Z7984 Long term (current) use of oral hypoglycemic drugs: Secondary | ICD-10-CM

## 2016-05-21 DIAGNOSIS — Z01812 Encounter for preprocedural laboratory examination: Secondary | ICD-10-CM

## 2016-05-21 DIAGNOSIS — Z8249 Family history of ischemic heart disease and other diseases of the circulatory system: Secondary | ICD-10-CM

## 2016-05-21 DIAGNOSIS — E1165 Type 2 diabetes mellitus with hyperglycemia: Secondary | ICD-10-CM | POA: Diagnosis present

## 2016-05-21 DIAGNOSIS — I771 Stricture of artery: Secondary | ICD-10-CM | POA: Diagnosis present

## 2016-05-21 DIAGNOSIS — I509 Heart failure, unspecified: Secondary | ICD-10-CM | POA: Diagnosis present

## 2016-05-21 DIAGNOSIS — C641 Malignant neoplasm of right kidney, except renal pelvis: Principal | ICD-10-CM | POA: Diagnosis present

## 2016-05-21 DIAGNOSIS — K219 Gastro-esophageal reflux disease without esophagitis: Secondary | ICD-10-CM | POA: Diagnosis present

## 2016-05-21 DIAGNOSIS — Z79899 Other long term (current) drug therapy: Secondary | ICD-10-CM

## 2016-05-21 DIAGNOSIS — D49511 Neoplasm of unspecified behavior of right kidney: Secondary | ICD-10-CM | POA: Diagnosis present

## 2016-05-21 DIAGNOSIS — F410 Panic disorder [episodic paroxysmal anxiety] without agoraphobia: Secondary | ICD-10-CM | POA: Diagnosis present

## 2016-05-21 DIAGNOSIS — N2889 Other specified disorders of kidney and ureter: Secondary | ICD-10-CM | POA: Diagnosis present

## 2016-05-21 DIAGNOSIS — Z9049 Acquired absence of other specified parts of digestive tract: Secondary | ICD-10-CM

## 2016-05-21 DIAGNOSIS — N521 Erectile dysfunction due to diseases classified elsewhere: Secondary | ICD-10-CM | POA: Diagnosis present

## 2016-05-21 DIAGNOSIS — C61 Malignant neoplasm of prostate: Secondary | ICD-10-CM | POA: Diagnosis present

## 2016-05-21 DIAGNOSIS — I110 Hypertensive heart disease with heart failure: Secondary | ICD-10-CM | POA: Diagnosis present

## 2016-05-21 DIAGNOSIS — Z0183 Encounter for blood typing: Secondary | ICD-10-CM

## 2016-05-21 DIAGNOSIS — E669 Obesity, unspecified: Secondary | ICD-10-CM | POA: Diagnosis present

## 2016-05-21 DIAGNOSIS — Z01818 Encounter for other preprocedural examination: Secondary | ICD-10-CM

## 2016-05-21 DIAGNOSIS — Z6827 Body mass index (BMI) 27.0-27.9, adult: Secondary | ICD-10-CM

## 2016-05-21 DIAGNOSIS — E1142 Type 2 diabetes mellitus with diabetic polyneuropathy: Secondary | ICD-10-CM | POA: Diagnosis present

## 2016-05-21 HISTORY — PX: ROBOTIC ASSITED PARTIAL NEPHRECTOMY: SHX6087

## 2016-05-21 LAB — GLUCOSE, CAPILLARY
GLUCOSE-CAPILLARY: 246 mg/dL — AB (ref 65–99)
GLUCOSE-CAPILLARY: 185 mg/dL — AB (ref 65–99)
Glucose-Capillary: 155 mg/dL — ABNORMAL HIGH (ref 65–99)
Glucose-Capillary: 188 mg/dL — ABNORMAL HIGH (ref 65–99)
Glucose-Capillary: 205 mg/dL — ABNORMAL HIGH (ref 65–99)

## 2016-05-21 LAB — BASIC METABOLIC PANEL
ANION GAP: 9 (ref 5–15)
BUN: 15 mg/dL (ref 6–20)
CALCIUM: 9 mg/dL (ref 8.9–10.3)
CO2: 27 mmol/L (ref 22–32)
Chloride: 101 mmol/L (ref 101–111)
Creatinine, Ser: 1.42 mg/dL — ABNORMAL HIGH (ref 0.61–1.24)
GFR, EST AFRICAN AMERICAN: 57 mL/min — AB (ref >60)
GFR, EST NON AFRICAN AMERICAN: 49 mL/min — AB (ref >60)
GLUCOSE: 243 mg/dL — AB (ref 65–99)
POTASSIUM: 4.8 mmol/L (ref 3.5–5.1)
SODIUM: 137 mmol/L (ref 135–145)

## 2016-05-21 LAB — HEMOGLOBIN AND HEMATOCRIT, BLOOD
HCT: 42.8 % (ref 39.0–52.0)
HEMOGLOBIN: 14.6 g/dL (ref 13.0–17.0)

## 2016-05-21 SURGERY — NEPHRECTOMY, PARTIAL, ROBOT-ASSISTED
Anesthesia: General | Laterality: Right

## 2016-05-21 MED ORDER — GABAPENTIN 300 MG PO CAPS
600.0000 mg | ORAL_CAPSULE | Freq: Every day | ORAL | Status: DC
  Filled 2016-05-21 (×2): qty 2

## 2016-05-21 MED ORDER — CEFAZOLIN SODIUM-DEXTROSE 2-4 GM/100ML-% IV SOLN
INTRAVENOUS | Status: AC
  Filled 2016-05-21: qty 100

## 2016-05-21 MED ORDER — LATANOPROST 0.005 % OP SOLN
1.0000 [drp] | Freq: Every day | OPHTHALMIC | Status: DC
  Administered 2016-05-21 – 2016-05-22 (×2): 1 [drp] via OPHTHALMIC
  Filled 2016-05-21: qty 2.5

## 2016-05-21 MED ORDER — SUGAMMADEX SODIUM 200 MG/2ML IV SOLN
INTRAVENOUS | Status: DC | PRN
  Administered 2016-05-21: 200 mg via INTRAVENOUS

## 2016-05-21 MED ORDER — ROCURONIUM BROMIDE 10 MG/ML (PF) SYRINGE
PREFILLED_SYRINGE | INTRAVENOUS | Status: DC | PRN
  Administered 2016-05-21: 20 mg via INTRAVENOUS
  Administered 2016-05-21: 50 mg via INTRAVENOUS
  Administered 2016-05-21: 10 mg via INTRAVENOUS
  Administered 2016-05-21: 20 mg via INTRAVENOUS
  Administered 2016-05-21 (×2): 10 mg via INTRAVENOUS

## 2016-05-21 MED ORDER — LIDOCAINE 2% (20 MG/ML) 5 ML SYRINGE
INTRAMUSCULAR | Status: AC
  Filled 2016-05-21: qty 5

## 2016-05-21 MED ORDER — HYDROMORPHONE HCL 2 MG/ML IJ SOLN
INTRAMUSCULAR | Status: AC
  Filled 2016-05-21: qty 1

## 2016-05-21 MED ORDER — ROCURONIUM BROMIDE 50 MG/5ML IV SOSY
PREFILLED_SYRINGE | INTRAVENOUS | Status: AC
  Filled 2016-05-21: qty 5

## 2016-05-21 MED ORDER — BELLADONNA ALKALOIDS-OPIUM 16.2-60 MG RE SUPP
1.0000 | Freq: Once | RECTAL | Status: AC
  Administered 2016-05-21: 1 via RECTAL
  Filled 2016-05-21: qty 1

## 2016-05-21 MED ORDER — DIPHENHYDRAMINE HCL 12.5 MG/5ML PO ELIX: 12.5000 mg | ORAL_SOLUTION | Freq: Four times a day (QID) | ORAL | Status: DC | PRN

## 2016-05-21 MED ORDER — PRAVASTATIN SODIUM 20 MG PO TABS
10.0000 mg | ORAL_TABLET | Freq: Every day | ORAL | Status: DC
  Administered 2016-05-21 – 2016-05-22 (×2): 10 mg via ORAL
  Filled 2016-05-21 (×2): qty 1

## 2016-05-21 MED ORDER — CEFAZOLIN IN D5W 1 GM/50ML IV SOLN
1.0000 g | Freq: Three times a day (TID) | INTRAVENOUS | Status: AC
  Administered 2016-05-21 – 2016-05-22 (×2): 1 g via INTRAVENOUS
  Filled 2016-05-21 (×2): qty 50

## 2016-05-21 MED ORDER — SODIUM CHLORIDE 0.9 % IJ SOLN
INTRAMUSCULAR | Status: AC
Start: 2016-05-21 — End: 2016-05-21
  Filled 2016-05-21: qty 10

## 2016-05-21 MED ORDER — FUROSEMIDE 10 MG/ML IJ SOLN
INTRAMUSCULAR | Status: DC | PRN
  Administered 2016-05-21: 10 mg via INTRAMUSCULAR

## 2016-05-21 MED ORDER — BUPIVACAINE LIPOSOME 1.3 % IJ SUSP
INTRAMUSCULAR | Status: AC
  Filled 2016-05-21: qty 20

## 2016-05-21 MED ORDER — ONDANSETRON HCL 4 MG/2ML IJ SOLN: 4.0000 mg | INTRAMUSCULAR | Status: DC | PRN

## 2016-05-21 MED ORDER — ALPRAZOLAM ER 0.5 MG PO TB24: 0.5000 mg | ORAL_TABLET | Freq: Every day | ORAL | Status: DC

## 2016-05-21 MED ORDER — SCOPOLAMINE 1 MG/3DAYS TD PT72
MEDICATED_PATCH | TRANSDERMAL | Status: AC
  Filled 2016-05-21: qty 1

## 2016-05-21 MED ORDER — HYDROMORPHONE HCL 1 MG/ML IJ SOLN
INTRAMUSCULAR | Status: DC | PRN
  Administered 2016-05-21: .4 mg via INTRAVENOUS

## 2016-05-21 MED ORDER — SODIUM CHLORIDE 0.9 % IJ SOLN
INTRAMUSCULAR | Status: AC
  Filled 2016-05-21: qty 10

## 2016-05-21 MED ORDER — DEXAMETHASONE SODIUM PHOSPHATE 10 MG/ML IJ SOLN
INTRAMUSCULAR | Status: AC
  Filled 2016-05-21: qty 1

## 2016-05-21 MED ORDER — MIDAZOLAM HCL 2 MG/2ML IJ SOLN
INTRAMUSCULAR | Status: DC | PRN
  Administered 2016-05-21: 2 mg via INTRAVENOUS

## 2016-05-21 MED ORDER — LACTATED RINGERS IV SOLN
INTRAVENOUS | Status: DC | PRN
  Administered 2016-05-21 (×2): via INTRAVENOUS

## 2016-05-21 MED ORDER — DIPHENHYDRAMINE HCL 50 MG/ML IJ SOLN: 12.5000 mg | Freq: Four times a day (QID) | INTRAMUSCULAR | Status: DC | PRN

## 2016-05-21 MED ORDER — HYDROMORPHONE HCL 1 MG/ML IJ SOLN
INTRAMUSCULAR | Status: AC
  Filled 2016-05-21: qty 1

## 2016-05-21 MED ORDER — SCOPOLAMINE 1 MG/3DAYS TD PT72
MEDICATED_PATCH | TRANSDERMAL | Status: DC | PRN
  Administered 2016-05-21: 1 via TRANSDERMAL

## 2016-05-21 MED ORDER — INSULIN ASPART 100 UNIT/ML ~~LOC~~ SOLN
SUBCUTANEOUS | Status: AC
  Filled 2016-05-21: qty 1

## 2016-05-21 MED ORDER — DEXAMETHASONE SODIUM PHOSPHATE 10 MG/ML IJ SOLN
INTRAMUSCULAR | Status: DC | PRN
  Administered 2016-05-21: 10 mg via INTRAVENOUS

## 2016-05-21 MED ORDER — INSULIN ASPART 100 UNIT/ML ~~LOC~~ SOLN
0.0000 [IU] | SUBCUTANEOUS | Status: DC
  Administered 2016-05-21: 5 [IU] via SUBCUTANEOUS
  Administered 2016-05-21: 3 [IU] via SUBCUTANEOUS
  Administered 2016-05-21: 5 [IU] via SUBCUTANEOUS
  Administered 2016-05-22: 3 [IU] via SUBCUTANEOUS
  Administered 2016-05-22: 2 [IU] via SUBCUTANEOUS
  Administered 2016-05-22: 5 [IU] via SUBCUTANEOUS
  Administered 2016-05-22: 2 [IU] via SUBCUTANEOUS
  Administered 2016-05-22 – 2016-05-23 (×2): 3 [IU] via SUBCUTANEOUS
  Administered 2016-05-23: 2 [IU] via SUBCUTANEOUS

## 2016-05-21 MED ORDER — BUPIVACAINE LIPOSOME 1.3 % IJ SUSP
INTRAMUSCULAR | Status: DC | PRN
  Administered 2016-05-21: 20 mL

## 2016-05-21 MED ORDER — ALPRAZOLAM 0.5 MG PO TABS: 0.5000 mg | ORAL_TABLET | Freq: Every evening | ORAL | Status: DC | PRN

## 2016-05-21 MED ORDER — PANTOPRAZOLE SODIUM 20 MG PO TBEC
20.0000 mg | DELAYED_RELEASE_TABLET | Freq: Every day | ORAL | Status: DC
  Administered 2016-05-22: 20 mg via ORAL
  Filled 2016-05-21 (×2): qty 1

## 2016-05-21 MED ORDER — LIDOCAINE 2% (20 MG/ML) 5 ML SYRINGE
INTRAMUSCULAR | Status: DC | PRN
  Administered 2016-05-21: 100 mg via INTRAVENOUS

## 2016-05-21 MED ORDER — LACTATED RINGERS IR SOLN
Status: DC | PRN
  Administered 2016-05-21: 1000 mL

## 2016-05-21 MED ORDER — ACETAMINOPHEN 10 MG/ML IV SOLN
1000.0000 mg | Freq: Four times a day (QID) | INTRAVENOUS | Status: AC
  Administered 2016-05-21 – 2016-05-22 (×4): 1000 mg via INTRAVENOUS
  Filled 2016-05-21 (×4): qty 100

## 2016-05-21 MED ORDER — MORPHINE SULFATE ER 15 MG PO TBCR
15.0000 mg | EXTENDED_RELEASE_TABLET | Freq: Two times a day (BID) | ORAL | Status: DC
  Administered 2016-05-21 – 2016-05-22 (×3): 15 mg via ORAL
  Filled 2016-05-21 (×3): qty 1

## 2016-05-21 MED ORDER — ALPRAZOLAM 0.5 MG PO TABS
0.5000 mg | ORAL_TABLET | Freq: Once | ORAL | Status: AC
  Administered 2016-05-21: 0.5 mg via ORAL
  Filled 2016-05-21: qty 1

## 2016-05-21 MED ORDER — PROPOFOL 10 MG/ML IV BOLUS
INTRAVENOUS | Status: DC | PRN
  Administered 2016-05-21: 150 mg via INTRAVENOUS

## 2016-05-21 MED ORDER — HYDROMORPHONE HCL 2 MG PO TABS: ORAL_TABLET | ORAL | 0 refills | Status: DC

## 2016-05-21 MED ORDER — PHENYLEPHRINE HCL 10 MG/ML IJ SOLN
INTRAMUSCULAR | Status: AC
  Filled 2016-05-21: qty 1

## 2016-05-21 MED ORDER — SUFENTANIL CITRATE 50 MCG/ML IV SOLN
INTRAVENOUS | Status: AC
  Filled 2016-05-21: qty 1

## 2016-05-21 MED ORDER — CEFAZOLIN SODIUM-DEXTROSE 2-4 GM/100ML-% IV SOLN
2.0000 g | INTRAVENOUS | Status: AC
  Administered 2016-05-21 (×2): 2 g via INTRAVENOUS
  Filled 2016-05-21: qty 100

## 2016-05-21 MED ORDER — DOCUSATE SODIUM 100 MG PO CAPS
100.0000 mg | ORAL_CAPSULE | Freq: Two times a day (BID) | ORAL | Status: DC
  Administered 2016-05-21 – 2016-05-22 (×3): 100 mg via ORAL
  Filled 2016-05-21 (×3): qty 1

## 2016-05-21 MED ORDER — ALPRAZOLAM ER 0.5 MG PO TB24
0.5000 mg | ORAL_TABLET | Freq: Every day | ORAL | Status: DC
  Administered 2016-05-21 – 2016-05-22 (×2): 0.5 mg via ORAL
  Filled 2016-05-21 (×2): qty 1

## 2016-05-21 MED ORDER — PHENYLEPHRINE 40 MCG/ML (10ML) SYRINGE FOR IV PUSH (FOR BLOOD PRESSURE SUPPORT)
PREFILLED_SYRINGE | INTRAVENOUS | Status: AC
  Filled 2016-05-21: qty 10

## 2016-05-21 MED ORDER — MIDAZOLAM HCL 2 MG/2ML IJ SOLN
INTRAMUSCULAR | Status: AC
  Filled 2016-05-21: qty 2

## 2016-05-21 MED ORDER — PHENYLEPHRINE 40 MCG/ML (10ML) SYRINGE FOR IV PUSH (FOR BLOOD PRESSURE SUPPORT)
PREFILLED_SYRINGE | INTRAVENOUS | Status: DC | PRN
  Administered 2016-05-21: 80 ug via INTRAVENOUS
  Administered 2016-05-21 (×2): 120 ug via INTRAVENOUS

## 2016-05-21 MED ORDER — LISINOPRIL 10 MG PO TABS
10.0000 mg | ORAL_TABLET | Freq: Every evening | ORAL | Status: DC
Start: 2016-05-22 — End: 2016-05-23
  Administered 2016-05-22: 10 mg via ORAL
  Filled 2016-05-21: qty 1

## 2016-05-21 MED ORDER — ONDANSETRON HCL 4 MG/2ML IJ SOLN
INTRAMUSCULAR | Status: DC | PRN
  Administered 2016-05-21: 4 mg via INTRAVENOUS

## 2016-05-21 MED ORDER — CARVEDILOL 6.25 MG PO TABS
6.2500 mg | ORAL_TABLET | Freq: Two times a day (BID) | ORAL | Status: DC
  Administered 2016-05-21: 6.25 mg via ORAL
  Filled 2016-05-21 (×3): qty 1

## 2016-05-21 MED ORDER — PROMETHAZINE HCL 25 MG/ML IJ SOLN: 6.2500 mg | INTRAMUSCULAR | Status: DC | PRN

## 2016-05-21 MED ORDER — POTASSIUM CHLORIDE IN NACL 20-0.45 MEQ/L-% IV SOLN
INTRAVENOUS | Status: DC
  Administered 2016-05-21 – 2016-05-22 (×2): via INTRAVENOUS
  Filled 2016-05-21 (×3): qty 1000

## 2016-05-21 MED ORDER — ALPRAZOLAM 0.5 MG PO TABS: 0.5000 mg | ORAL_TABLET | Freq: Every day | ORAL | Status: DC | PRN

## 2016-05-21 MED ORDER — ACETAMINOPHEN 10 MG/ML IV SOLN
INTRAVENOUS | Status: AC
  Filled 2016-05-21: qty 100

## 2016-05-21 MED ORDER — PHENYLEPHRINE HCL 10 MG/ML IJ SOLN
INTRAVENOUS | Status: DC | PRN
  Administered 2016-05-21: 50 ug/min via INTRAVENOUS

## 2016-05-21 MED ORDER — HYDROMORPHONE HCL 1 MG/ML IJ SOLN
0.2500 mg | INTRAMUSCULAR | Status: DC | PRN
  Administered 2016-05-21: 0.5 mg via INTRAVENOUS

## 2016-05-21 MED ORDER — MORPHINE SULFATE (PF) 2 MG/ML IV SOLN: 2.0000 mg | INTRAVENOUS | Status: DC | PRN

## 2016-05-21 MED ORDER — SUGAMMADEX SODIUM 200 MG/2ML IV SOLN
INTRAVENOUS | Status: AC
  Filled 2016-05-21: qty 2

## 2016-05-21 MED ORDER — SUCCINYLCHOLINE CHLORIDE 200 MG/10ML IV SOSY
PREFILLED_SYRINGE | INTRAVENOUS | Status: DC | PRN
  Administered 2016-05-21: 120 mg via INTRAVENOUS

## 2016-05-21 MED ORDER — SUCCINYLCHOLINE CHLORIDE 200 MG/10ML IV SOSY
PREFILLED_SYRINGE | INTRAVENOUS | Status: AC
  Filled 2016-05-21: qty 10

## 2016-05-21 MED ORDER — ONDANSETRON HCL 4 MG/2ML IJ SOLN
INTRAMUSCULAR | Status: AC
  Filled 2016-05-21: qty 2

## 2016-05-21 MED ORDER — PROPOFOL 10 MG/ML IV BOLUS
INTRAVENOUS | Status: AC
  Filled 2016-05-21: qty 20

## 2016-05-21 MED ORDER — SUFENTANIL CITRATE 50 MCG/ML IV SOLN
INTRAVENOUS | Status: DC | PRN
  Administered 2016-05-21: 10 ug via INTRAVENOUS
  Administered 2016-05-21: 20 ug via INTRAVENOUS
  Administered 2016-05-21 (×2): 10 ug via INTRAVENOUS

## 2016-05-21 MED ORDER — SODIUM CHLORIDE 0.9 % IJ SOLN
INTRAMUSCULAR | Status: DC | PRN
  Administered 2016-05-21: 20 mL

## 2016-05-21 MED ORDER — SENNA 8.6 MG PO TABS
1.0000 | ORAL_TABLET | Freq: Two times a day (BID) | ORAL | Status: DC
  Administered 2016-05-21 – 2016-05-22 (×3): 8.6 mg via ORAL
  Filled 2016-05-21 (×3): qty 1

## 2016-05-21 MED ORDER — SODIUM CHLORIDE 0.9 % IJ SOLN
INTRAMUSCULAR | Status: AC
  Filled 2016-05-21: qty 50

## 2016-05-21 SURGICAL SUPPLY — 59 items
ADH SKN CLS APL DERMABOND .7 (GAUZE/BANDAGES/DRESSINGS) ×1
AGENT HMST KT MTR STRL THRMB (HEMOSTASIS) ×1
APL ESCP 34 STRL LF DISP (HEMOSTASIS) ×1
APPLICATOR SURGIFLO ENDO (HEMOSTASIS) ×2 IMPLANT
BAG SPEC RTRVL LRG 6X4 10 (ENDOMECHANICALS) ×1
CHLORAPREP W/TINT 26ML (MISCELLANEOUS) ×3 IMPLANT
CLIP LIGATING HEM O LOK PURPLE (MISCELLANEOUS) ×3 IMPLANT
CLIP LIGATING HEMO O LOK GREEN (MISCELLANEOUS) ×4 IMPLANT
COVER SURGICAL LIGHT HANDLE (MISCELLANEOUS) ×3 IMPLANT
COVER TIP SHEARS 8 DVNC (MISCELLANEOUS) ×1 IMPLANT
COVER TIP SHEARS 8MM DA VINCI (MISCELLANEOUS) ×2
DECANTER SPIKE VIAL GLASS SM (MISCELLANEOUS) ×3 IMPLANT
DERMABOND ADVANCED (GAUZE/BANDAGES/DRESSINGS) ×2
DERMABOND ADVANCED .7 DNX12 (GAUZE/BANDAGES/DRESSINGS) ×2 IMPLANT
DRAIN CHANNEL 15F RND FF 3/16 (WOUND CARE) ×3 IMPLANT
DRAPE ARM DVNC X/XI (DISPOSABLE) ×4 IMPLANT
DRAPE COLUMN DVNC XI (DISPOSABLE) ×1 IMPLANT
DRAPE DA VINCI XI ARM (DISPOSABLE) ×8
DRAPE DA VINCI XI COLUMN (DISPOSABLE) ×2
DRAPE INCISE IOBAN 66X45 STRL (DRAPES) ×3 IMPLANT
DRAPE SHEET LG 3/4 BI-LAMINATE (DRAPES) ×3 IMPLANT
DRSG TEGADERM 4X4.75 (GAUZE/BANDAGES/DRESSINGS) ×2 IMPLANT
ELECT PENCIL ROCKER SW 15FT (MISCELLANEOUS) ×3 IMPLANT
ELECT REM PT RETURN 9FT ADLT (ELECTROSURGICAL) ×3
ELECTRODE REM PT RTRN 9FT ADLT (ELECTROSURGICAL) ×1 IMPLANT
EVACUATOR SILICONE 100CC (DRAIN) ×3 IMPLANT
GAUZE SPONGE 2X2 8PLY STRL LF (GAUZE/BANDAGES/DRESSINGS) IMPLANT
GLOVE BIO SURGEON STRL SZ 6.5 (GLOVE) ×2 IMPLANT
GLOVE BIO SURGEONS STRL SZ 6.5 (GLOVE) ×1
GLOVE BIOGEL M STRL SZ7.5 (GLOVE) ×10 IMPLANT
GOWN STRL REUS W/TWL LRG LVL3 (GOWN DISPOSABLE) ×9 IMPLANT
HEMOSTAT SURGICEL 4X8 (HEMOSTASIS) IMPLANT
IRRIG SUCT STRYKERFLOW 2 WTIP (MISCELLANEOUS) ×3
IRRIGATION SUCT STRKRFLW 2 WTP (MISCELLANEOUS) ×1 IMPLANT
KIT BASIN OR (CUSTOM PROCEDURE TRAY) ×3 IMPLANT
POSITIONER SURGICAL ARM (MISCELLANEOUS) ×4 IMPLANT
POUCH SPECIMEN RETRIEVAL 10MM (ENDOMECHANICALS) ×3 IMPLANT
SEAL CANN UNIV 5-8 DVNC XI (MISCELLANEOUS) ×4 IMPLANT
SEAL XI 5MM-8MM UNIVERSAL (MISCELLANEOUS) ×8
SOLUTION ELECTROLUBE (MISCELLANEOUS) ×3 IMPLANT
SPONGE GAUZE 2X2 STER 10/PKG (GAUZE/BANDAGES/DRESSINGS) ×2
SURGIFLO W/THROMBIN 8M KIT (HEMOSTASIS) ×3 IMPLANT
SUT ETHILON 3 0 PS 1 (SUTURE) ×3 IMPLANT
SUT MNCRL AB 4-0 PS2 18 (SUTURE) ×6 IMPLANT
SUT V-LOC BARB 180 2/0GR6 GS22 (SUTURE) ×3
SUT VIC AB 0 CT1 27 (SUTURE) ×3
SUT VIC AB 0 CT1 27XBRD ANTBC (SUTURE) ×1 IMPLANT
SUT VICRYL 0 UR6 27IN ABS (SUTURE) ×6 IMPLANT
SUT VLOC BARB 180 ABS3/0GR12 (SUTURE) ×3
SUTURE V-LC BRB 180 2/0GR6GS22 (SUTURE) ×1 IMPLANT
SUTURE VLOC BRB 180 ABS3/0GR12 (SUTURE) ×1 IMPLANT
TAPE STRIPS DRAPE STRL (GAUZE/BANDAGES/DRESSINGS) ×1 IMPLANT
TOWEL OR 17X26 10 PK STRL BLUE (TOWEL DISPOSABLE) ×4 IMPLANT
TRAY FOLEY W/METER SILVER 16FR (SET/KITS/TRAYS/PACK) ×3 IMPLANT
TRAY LAPAROSCOPIC (CUSTOM PROCEDURE TRAY) ×3 IMPLANT
TROCAR BLADELESS OPT 5 100 (ENDOMECHANICALS) ×3 IMPLANT
TROCAR XCEL 12X100 BLDLESS (ENDOMECHANICALS) ×3 IMPLANT
WATER STERILE IRR 1000ML POUR (IV SOLUTION) ×2 IMPLANT
WATER STERILE IRR 1500ML POUR (IV SOLUTION) ×2 IMPLANT

## 2016-05-21 NOTE — Progress Notes (Signed)
Hgb. And Hct. And B met drawn by lab. 

## 2016-05-21 NOTE — Op Note (Signed)
Preoperative diagnosis: Right renal neoplasm  Postoperative diagnosis: Right renal neoplasm  Procedure:  1. Right robotic-assisted laparoscopic partial nephrectomy 2. Intraoperative renal ultrasonography  Surgeon: Pryor Curia. M.D.  Assistant(s): Dr. Virginia Crews  An assistant was required for this surgical procedure.  The duties of the assistant included but were not limited to suctioning, passing suture, camera manipulation, retraction. This procedure would not be able to be performed without an Environmental consultant.  Anesthesia: General  Complications: None  EBL: 50 mL  IVF:  1600 mL crystalloid  Specimens: 1. Right renal neoplasm  Disposition of specimens: Pathology  Intraoperative findings:       1. Warm renal ischemia time: 16 minutes       2. Intraoperative renal ultrasound findings: There was noted to be a 1.6 cm hyperechoic mass that was endophytic and located in the anterior interpolar region of the kidney corresponding to his findings on preoperative MRI.  Drains: 1. # 15 Blake perinephric drain  Indication:  Joseph Bird is a 71 y.o. year old patient with a right renal mass.  After a thorough review of the management options for their renal mass, they elected to proceed with surgical treatment and the above procedure.  We have discussed the potential benefits and risks of the procedure, side effects of the proposed treatment, the likelihood of the patient achieving the goals of the procedure, and any potential problems that might occur during the procedure or recuperation. Informed consent has been obtained.   Description of procedure:  The patient was taken to the operating room and a general anesthetic was administered. The patient was given preoperative antibiotics, placed in the right modified flank position with care to pad all potential pressure points, and prepped and draped in the usual sterile fashion. Next a preoperative timeout was performed.  A site  was selected on in the midline for placement of the assistant port. This was placed using a standard open Hassan technique which allowed entry into the peritoneal cavity under direct vision and without difficulty. A 12 mm port was placed and a pneumoperitoneum established. The camera was then used to inspect the abdomen and there was no evidence of any intra-abdominal injuries or other abnormalities. The remaining abdominal ports were then placed. 8 mm robotic ports were placed in the right upper quadrant, right lower quadrant, and far right lateral abdominal wall. An 8 mm port was placed for the camera site just right of the umbilicus. All ports were placed under direct vision without difficulty. The surgical cart was then docked.   Utilizing the cautery scissors, the white line of Toldt was incised allowing the colon to be mobilized medially and the plane between the mesocolon and the anterior layer of Gerota's fascia to be developed and the kidney to be exposed.  The ureter and gonadal vein were identified inferiorly and the ureter was lifted anteriorly off the psoas muscle.  Dissection proceeded superiorly along the gonadal vein until the renal vein was identified.  The renal hilum was then carefully isolated with a combination of blunt and sharp dissection allowing the renal arterial and venous structures to be separated and isolated in preparation for renal hilar vessel clamping. There was single main renal artery and a second lower pole renal artery.  There was a single main renal vein. 12.5 g of IV mannitol was then administered.   Attention turned to the kidney and the perinephric fat surrounding the renal mass was removed and the kidney was mobilized sufficiently for  exposure and resection of the renal mass.   Intraoperative renal ultrasonography was utilized with the laparoscopic ultrasound probe to identify the renal tumor and identify the tumor margins.   Once the renal mass was properly  isolated, preparations were made for resection of the tumor.  Reconstructive sutures were placed into the abdomen for the renorrhaphy portion of the procedure.  The main renal artery was then clamped with bulldog clamps.  The tumor was then excised with cold scissor dissection along with an adequate visible gross margin of normal renal parenchyma. The tumor appeared to be excised without any gross violation of the tumor. The renal collecting system was entered during removal of the tumor.  A running 3-0 V-lock suture was then brought through the capsule of the kidney and run along the base of the renal defect to provide hemostasis and close any entry into the renal collecting system if present. Weck clips were used to secure this suture outside the renal capsule at the proximal and distal ends. An additional hemostatic agent (Surgiflo) was then placed into the renal defect. A running 2-0 V lock suture was then used to close the capsule of the kidney using a sliding clip technique which resulted in excellent hemostasis.    The bulldog clamps were then removed from the renal hilar vessel(s) and an additional 12.5 g of IV mannitol was administered. Total warm renal ischemia time was 16 minutes. The renal tumor resection site was examined. Hemostasis appeared adequate.   The kidney was placed back into its normal anatomic position and covered with perinephric fat as needed.  A # 97 Blake drain was then brought through the lateral lower port site and positioned in the perinephric space.  It was secured to the skin with a nylon suture. The surgical cart was undocked.  The renal tumor specimen was removed intact within an endopouch retrieval bag via the upper midline port site. This incision site was closed at the fascial layer with 0-vicryl suture. All other laparoscopic/robotic ports were removed under direct vision and the pneumoperitoneum let down with inspection of the operative field performed and hemostasis  again confirmed. All incision sites were then injected with local anesthetic and reapproximated at the skin level with 4-0 monocryl subcuticular closures.  Liquiband was applied to the skin.  It was confirmed by pathology that we did grossly resect the mass (it was not able to be visualized intraoperatively due to its endophytic nature)  The patient tolerated the procedure well and without complications.  The patient was able to be extubated and transferred to the recovery unit in satisfactory condition.  Pryor Curia MD

## 2016-05-21 NOTE — Anesthesia Procedure Notes (Signed)
Procedure Name: Intubation Date/Time: 05/21/2016 7:25 AM Performed by: Danley Danker L Patient Re-evaluated:Patient Re-evaluated prior to inductionOxygen Delivery Method: Circle system utilized Preoxygenation: Pre-oxygenation with 100% oxygen Intubation Type: IV induction Ventilation: Mask ventilation without difficulty and Oral airway inserted - appropriate to patient size Laryngoscope Size: Miller and 3 Grade View: Grade II Tube type: Oral Tube size: 8.0 mm Number of attempts: 1 Airway Equipment and Method: Stylet Placement Confirmation: ETT inserted through vocal cords under direct vision,  positive ETCO2 and breath sounds checked- equal and bilateral Secured at: 21 cm Tube secured with: Tape Dental Injury: Teeth and Oropharynx as per pre-operative assessment

## 2016-05-21 NOTE — Progress Notes (Signed)
Dr. Kalman Shan in and made aware of patient's CBG results in [id number]. To start Dr. Ninfa Meeker  Insulin Sliding Scale orders - also made aware of patient's EKG readings on monitor- frequent PACS AND T wave abnormality- no new orders obtained

## 2016-05-21 NOTE — Discharge Summary (Signed)
Date of admission: 05/21/2016  Date of discharge: 05/23/2016  Admission diagnosis: Right renal mass  Discharge diagnosis: Right renal mass  History and Physical: For full details, please see admission history and physical. Briefly, Joseph Bird is a 71 y.o. gentleman with right renal mass.  After discussing management/treatment options, he elected to proceed with surgical treatment.  Hospital Course: Joseph Bird was taken to the operating room on 05/21/2016 and underwent a robotic assisted laparoscopic right partial nephrectomy. He tolerated this procedure well and without complications. Postoperatively, he was able to be transferred to a regular hospital room following recovery from anesthesia.  He remained hemodynamically stable overnight.  He had excellent urine output with moderate output from his drain. A creatinine level was checked on his drain and was consistent with serum creatinine. His drain was removed on POD #2.  He was transitioned to oral pain medication, tolerated a regular diet, and had met all discharge criteria and was able to be discharged home later on POD#2. Pathology results were discussed prior to discharge.  Laboratory values:   Recent Labs  05/20/16 1029 05/21/16 1201 05/22/16 0508  HGB 14.3 14.6 13.9  HCT 41.6 42.8 40.2    Disposition: Home  Discharge instruction: He was instructed to be ambulatory but to refrain from heavy lifting, strenuous activity, or driving.  Discharge medications:   Allergies as of 05/23/2016      Reactions   Demerol [meperidine] Other (See Comments)   "Made him crazy."      Medication List    STOP taking these medications   aspirin 325 MG EC tablet   HYDROmorphone 2 MG tablet Commonly known as:  DILAUDID   ibuprofen 200 MG tablet Commonly known as:  ADVIL,MOTRIN     TAKE these medications   ALPRAZolam 0.5 MG 24 hr tablet Commonly known as:  XANAX XR Take 0.5 mg by mouth at bedtime.   calcium-vitamin D 500-200  MG-UNIT tablet Commonly known as:  OSCAL WITH D Take 1 tablet by mouth daily with breakfast.   carvedilol 6.25 MG tablet Commonly known as:  COREG Take 6.25 mg by mouth 2 (two) times daily with a meal.   gabapentin 600 MG tablet Commonly known as:  NEURONTIN Take 600 mg by mouth daily as needed.   glipiZIDE 5 MG tablet Commonly known as:  GLUCOTROL Take 2.5 mg by mouth 2 (two) times daily before a meal.   lansoprazole 15 MG capsule Commonly known as:  PREVACID Take 15 mg by mouth daily.   lisinopril 20 MG tablet Commonly known as:  PRINIVIL,ZESTRIL Take 10 mg by mouth every evening.   metFORMIN 1000 MG tablet Commonly known as:  GLUCOPHAGE Take 1,000 mg by mouth 2 (two) times daily with a meal.   morphine 15 MG 12 hr tablet Commonly known as:  MS CONTIN Take 15 mg by mouth every 12 (twelve) hours. Verified with [facility]   multivitamin with minerals Tabs tablet Take 1 tablet by mouth daily.   ondansetron 4 MG tablet Commonly known as:  ZOFRAN Take 4 mg by mouth every 8 (eight) hours as needed for nausea. Takes 1 tablet in the morning and then again only if needed   pravastatin 10 MG tablet Commonly known as:  PRAVACHOL Take 10 mg by mouth at bedtime.   Travoprost (BAK Free) 0.004 % Soln ophthalmic solution Commonly known as:  TRAVATAN Place 1 drop into the right eye at bedtime.   Vitamin D 2000 units Caps Take 2,000 Units  by mouth daily.       Followup: He will followup for post op check.

## 2016-05-21 NOTE — Interval H&P Note (Signed)
History and Physical Interval Note:  05/21/2016 7:07 AM  Steele Sizer  has presented today for surgery, with the diagnosis of RIGHT RENAL NEOPLASM  The various methods of treatment have been discussed with the patient and family. After consideration of risks, benefits and other options for treatment, the patient has consented to  Procedure(s): XI ROBOTIC ASSITED PARTIAL NEPHRECTOMY (Right) as a surgical intervention .  The patient's history has been reviewed, patient examined, no change in status, stable for surgery.  I have reviewed the patient's chart and labs.  Questions were answered to the patient's satisfaction.     Vika Buske,LES

## 2016-05-21 NOTE — Anesthesia Postprocedure Evaluation (Signed)
Anesthesia Post Note  Patient: Joseph Bird  Procedure(s) Performed: Procedure(s) (LRB): XI ROBOTIC ASSITED PARTIAL NEPHRECTOMY (Right)  Patient location during evaluation: PACU Anesthesia Type: General Level of consciousness: awake and alert Pain management: pain level controlled Vital Signs Assessment: post-procedure vital signs reviewed and stable Respiratory status: spontaneous breathing, nonlabored ventilation, respiratory function stable and patient connected to nasal cannula oxygen Cardiovascular status: blood pressure returned to baseline and stable Postop Assessment: no signs of nausea or vomiting Anesthetic complications: no       Last Vitals:  Vitals:   05/21/16 1200 05/21/16 1215  BP: 130/84 (!) 146/77  Pulse: 83 83  Resp: 13 15  Temp:      Last Pain:  Vitals:   05/21/16 0524  TempSrc: Oral                 Broedy Osbourne S

## 2016-05-21 NOTE — Progress Notes (Signed)
Day of Surgery Subjective: The patient is currently having a panic attack, treating with xanax.  No nausea or vomiting. Pain is adequately controlled. Labs appropriate. Drain output noted to be high (115 ml + ~97ml in bulb)  Objective: Vital signs in last 24 hours: Temp:  [97.9 F (36.6 C)-98.7 F (37.1 C)] 98.7 F (37.1 C) (01/18 1500) Pulse Rate:  [47-87] 79 (01/18 1500) Resp:  [10-19] 12 (01/18 1500) BP: (99-146)/(64-93) 121/87 (01/18 1500) SpO2:  [92 %-100 %] 98 % (01/18 1500) Weight:  [88.9 kg (196 lb)] 88.9 kg (196 lb) (01/18 0546)  Intake/Output from previous day: No intake/output data recorded. Intake/Output this shift: Total I/O In: 2008.3 [I.V.:1908.3; IV Piggyback:100] Out: 575 [Urine:410; Drains:115; Blood:50]  Physical Exam:  General: Alert and oriented, anxious CV: RRR Lungs: normal work of breathing GI: Soft, Nondistended. Incisions: Clean and dry, Jp with thin SS fluid Urine: Clear Extremities: Nontender, no erythema, no edema.  Lab Results:  Recent Labs  05/20/16 1029 05/21/16 1201  HGB 14.3 14.6  HCT 41.6 42.8          Recent Labs  05/20/16 1029 05/21/16 1201  CREATININE 0.85 1.42*           Results for orders placed or performed during the hospital encounter of 05/21/16 (from the past 24 hour(s))  Glucose, capillary     Status: Abnormal   Collection Time: 05/21/16  5:13 AM  Result Value Ref Range   Glucose-Capillary 155 (H) 65 - 99 mg/dL   Comment 1 Notify RN   Basic metabolic panel     Status: Abnormal   Collection Time: 05/21/16 12:01 PM  Result Value Ref Range   Sodium 137 135 - 145 mmol/L   Potassium 4.8 3.5 - 5.1 mmol/L   Chloride 101 101 - 111 mmol/L   CO2 27 22 - 32 mmol/L   Glucose, Bld 243 (H) 65 - 99 mg/dL   BUN 15 6 - 20 mg/dL   Creatinine, Ser 1.42 (H) 0.61 - 1.24 mg/dL   Calcium 9.0 8.9 - 10.3 mg/dL   GFR calc non Af Amer 49 (L) >60 mL/min   GFR calc Af Amer 57 (L) >60 mL/min   Anion gap 9 5 - 15  Hemoglobin and  hematocrit, blood     Status: None   Collection Time: 05/21/16 12:01 PM  Result Value Ref Range   Hemoglobin 14.6 13.0 - 17.0 g/dL   HCT 42.8 39.0 - 52.0 %  Glucose, capillary     Status: Abnormal   Collection Time: 05/21/16 12:04 PM  Result Value Ref Range   Glucose-Capillary 246 (H) 65 - 99 mg/dL   Comment 1 Notify RN    Comment 2 Document in Chart    Comment 3 Call MD NNP PA CNM   Glucose, capillary     Status: Abnormal   Collection Time: 05/21/16  2:17 PM  Result Value Ref Range   Glucose-Capillary 185 (H) 65 - 99 mg/dL   Comment 1 Notify RN    Comment 2 Document in Chart   Glucose, capillary     Status: Abnormal   Collection Time: 05/21/16  4:11 PM  Result Value Ref Range   Glucose-Capillary 188 (H) 65 - 99 mg/dL    Assessment/Plan: POD# 0 s/p robotic right partial nephrectomy.  1) Bedrest, Incentive spirometry 2) Clear liquid diet 3) Tylenol, IV pain medication 4) Follow JP output, possible JP creatinine in AM 5) Continue urethral catheter  Lolita Rieger 05/21/2016, 5:21 PM

## 2016-05-21 NOTE — Progress Notes (Signed)
Pt's wife says he "has PTSD and will do better when he gets something to help him relax". Gets anxious without her.

## 2016-05-21 NOTE — Transfer of Care (Signed)
Immediate Anesthesia Transfer of Care Note  Patient: MOATAZ KERWIN  Procedure(s) Performed: Procedure(s) with comments: XI ROBOTIC ASSITED PARTIAL NEPHRECTOMY (Right) - clamp time: 16 minutes  Patient Location: PACU  Anesthesia Type:General  Level of Consciousness: awake  Airway & Oxygen Therapy: Patient Spontanous Breathing and Patient connected to face mask oxygen  Post-op Assessment: Report given to RN and Post -op Vital signs reviewed and stable  Post vital signs: Reviewed and stable  Last Vitals:  Vitals:   05/21/16 0524  BP: (!) 143/72  Pulse: (!) 47  Resp: 18  Temp: 36.6 C    Last Pain:  Vitals:   05/21/16 0524  TempSrc: Oral      Patients Stated Pain Goal: 3 ([phone] [id number])  Complications: No apparent anesthesia complications

## 2016-05-21 NOTE — Discharge Instructions (Signed)

## 2016-05-21 NOTE — Progress Notes (Signed)
Lab results noted- Hgb. And Hct. And B met 

## 2016-05-22 ENCOUNTER — Encounter (HOSPITAL_COMMUNITY): Payer: Self-pay | Admitting: Urology

## 2016-05-22 DIAGNOSIS — I509 Heart failure, unspecified: Secondary | ICD-10-CM | POA: Diagnosis present

## 2016-05-22 DIAGNOSIS — K219 Gastro-esophageal reflux disease without esophagitis: Secondary | ICD-10-CM | POA: Diagnosis present

## 2016-05-22 DIAGNOSIS — E1165 Type 2 diabetes mellitus with hyperglycemia: Secondary | ICD-10-CM | POA: Diagnosis present

## 2016-05-22 DIAGNOSIS — Z7984 Long term (current) use of oral hypoglycemic drugs: Secondary | ICD-10-CM | POA: Diagnosis not present

## 2016-05-22 DIAGNOSIS — Z6827 Body mass index (BMI) 27.0-27.9, adult: Secondary | ICD-10-CM | POA: Diagnosis not present

## 2016-05-22 DIAGNOSIS — Z8249 Family history of ischemic heart disease and other diseases of the circulatory system: Secondary | ICD-10-CM | POA: Diagnosis not present

## 2016-05-22 DIAGNOSIS — Z9049 Acquired absence of other specified parts of digestive tract: Secondary | ICD-10-CM | POA: Diagnosis not present

## 2016-05-22 DIAGNOSIS — N2889 Other specified disorders of kidney and ureter: Secondary | ICD-10-CM | POA: Diagnosis present

## 2016-05-22 DIAGNOSIS — C641 Malignant neoplasm of right kidney, except renal pelvis: Secondary | ICD-10-CM | POA: Diagnosis present

## 2016-05-22 DIAGNOSIS — I771 Stricture of artery: Secondary | ICD-10-CM | POA: Diagnosis present

## 2016-05-22 DIAGNOSIS — Z01818 Encounter for other preprocedural examination: Secondary | ICD-10-CM | POA: Diagnosis not present

## 2016-05-22 DIAGNOSIS — I110 Hypertensive heart disease with heart failure: Secondary | ICD-10-CM | POA: Diagnosis present

## 2016-05-22 DIAGNOSIS — Z0183 Encounter for blood typing: Secondary | ICD-10-CM | POA: Diagnosis not present

## 2016-05-22 DIAGNOSIS — E1142 Type 2 diabetes mellitus with diabetic polyneuropathy: Secondary | ICD-10-CM | POA: Diagnosis present

## 2016-05-22 DIAGNOSIS — F410 Panic disorder [episodic paroxysmal anxiety] without agoraphobia: Secondary | ICD-10-CM | POA: Diagnosis present

## 2016-05-22 DIAGNOSIS — Z79899 Other long term (current) drug therapy: Secondary | ICD-10-CM | POA: Diagnosis not present

## 2016-05-22 DIAGNOSIS — N521 Erectile dysfunction due to diseases classified elsewhere: Secondary | ICD-10-CM | POA: Diagnosis present

## 2016-05-22 DIAGNOSIS — C61 Malignant neoplasm of prostate: Secondary | ICD-10-CM | POA: Diagnosis present

## 2016-05-22 DIAGNOSIS — Z01812 Encounter for preprocedural laboratory examination: Secondary | ICD-10-CM | POA: Diagnosis not present

## 2016-05-22 DIAGNOSIS — E669 Obesity, unspecified: Secondary | ICD-10-CM | POA: Diagnosis present

## 2016-05-22 LAB — BASIC METABOLIC PANEL
Anion gap: 7 (ref 5–15)
BUN: 17 mg/dL (ref 6–20)
CALCIUM: 9.1 mg/dL (ref 8.9–10.3)
CO2: 27 mmol/L (ref 22–32)
CREATININE: 1.06 mg/dL (ref 0.61–1.24)
Chloride: 104 mmol/L (ref 101–111)
GFR calc non Af Amer: >60 mL/min (ref >60)
Glucose, Bld: 146 mg/dL — ABNORMAL HIGH (ref 65–99)
Potassium: 4.3 mmol/L (ref 3.5–5.1)
SODIUM: 138 mmol/L (ref 135–145)

## 2016-05-22 LAB — GLUCOSE, CAPILLARY
GLUCOSE-CAPILLARY: 205 mg/dL — AB (ref 65–99)
GLUCOSE-CAPILLARY: 196 mg/dL — AB (ref 65–99)
GLUCOSE-CAPILLARY: 182 mg/dL — AB (ref 65–99)
Glucose-Capillary: 119 mg/dL — ABNORMAL HIGH (ref 65–99)
Glucose-Capillary: 138 mg/dL — ABNORMAL HIGH (ref 65–99)
Glucose-Capillary: 141 mg/dL — ABNORMAL HIGH (ref 65–99)
Glucose-Capillary: 186 mg/dL — ABNORMAL HIGH (ref 65–99)

## 2016-05-22 LAB — CREATININE, FLUID (PLEURAL, PERITONEAL, JP DRAINAGE): Creat, Fluid: 1.1 mg/dL

## 2016-05-22 LAB — HEMOGLOBIN A1C
HEMOGLOBIN A1C: 7.6 % — AB (ref 4.8–5.6)
MEAN PLASMA GLUCOSE: 171 mg/dL

## 2016-05-22 LAB — HEMOGLOBIN AND HEMATOCRIT, BLOOD
HCT: 40.2 % (ref 39.0–52.0)
HEMOGLOBIN: 13.9 g/dL (ref 13.0–17.0)

## 2016-05-22 MED ORDER — ACETAMINOPHEN 325 MG PO TABS: 650.0000 mg | ORAL_TABLET | Freq: Four times a day (QID) | ORAL | Status: DC | PRN

## 2016-05-22 MED ORDER — HYDROMORPHONE HCL 2 MG PO TABS: 2.0000 mg | ORAL_TABLET | ORAL | Status: DC | PRN

## 2016-05-22 MED ORDER — ACETAMINOPHEN 500 MG PO TABS
1000.0000 mg | ORAL_TABLET | Freq: Four times a day (QID) | ORAL | Status: DC
  Administered 2016-05-22: 1000 mg via ORAL
  Filled 2016-05-22: qty 2

## 2016-05-22 MED ORDER — BISACODYL 10 MG RE SUPP
10.0000 mg | Freq: Once | RECTAL | Status: AC
  Administered 2016-05-22: 10 mg via RECTAL
  Filled 2016-05-22: qty 1

## 2016-05-22 NOTE — Progress Notes (Signed)
Inpatient Diabetes Program Recommendations  AACE/ADA: New Consensus Statement on Inpatient Glycemic Control (2015)  Target Ranges:  Prepandial:   less than 140 mg/dL      Peak postprandial:   less than 180 mg/dL (1-2 hours)      Critically ill patients:  140 - 180 mg/dL   Lab Results  Component Value Date   GLUCAP 186 (H) 05/22/2016   HGBA1C 7.6 (H) 05/20/2016   Review of Glycemic Control  Diabetes history: DM 2 Outpatient Diabetes medications: Glipizide 2.5 mg BID, Metformin 1000 mg BID Current orders for Inpatient glycemic control: Novolog Moderate Q4hours  Inpatient Diabetes Program Recommendations:   Patient currently on regular diet. Please change diet to carb modified.  Thanks, Tama Headings RN, MSN, [facility] Inpatient Diabetes Coordinator Team Pager [phone] (8a-5p)

## 2016-05-22 NOTE — Progress Notes (Signed)
1 Day Post-Op Subjective: The patient is doing well.  No nausea or vomiting. Pain is adequately controlled. Cr 1.06 from 1.4, Hb stable. JP with 385 out, 165ml since midnight.  Objective: Vital signs in last 24 hours: Temp:  [98.2 F (36.8 C)-98.9 F (37.2 C)] 98.7 F (37.1 C) (01/19 0418) Pulse Rate:  [65-87] 65 (01/19 0418) Resp:  [10-19] 16 (01/19 0418) BP: (99-146)/(64-93) 131/76 (01/19 0418) SpO2:  [92 %-100 %] 98 % (01/19 0418)  Intake/Output from previous day: 01/18 0701 - 01/19 0700 In: 3025.8 [I.V.:2675.8; IV Piggyback:350] Out: [id number] [Urine:1910; Drains:385; Blood:50] Intake/Output this shift: Total I/O In: 775 [I.V.:525; IV Piggyback:250] Out: 1700 [Urine:1500; Drains:200]  Physical Exam:  General: Alert and oriented. CV: RRR Lungs: Clear bilaterally. GI: Soft, Nondistended. Incisions: Clean and dry, JP with SS fluid Urine: Clear Extremities: Nontender, no erythema, no edema.  Lab Results:  Recent Labs  05/20/16 1029 05/21/16 1201 05/22/16 0508  HGB 14.3 14.6 13.9  HCT 41.6 42.8 40.2          Recent Labs  05/20/16 1029 05/21/16 1201 05/22/16 0508  CREATININE 0.85 1.42* 1.06           Results for orders placed or performed during the hospital encounter of 05/21/16 (from the past 24 hour(s))  Basic metabolic panel     Status: Abnormal   Collection Time: 05/21/16 12:01 PM  Result Value Ref Range   Sodium 137 135 - 145 mmol/L   Potassium 4.8 3.5 - 5.1 mmol/L   Chloride 101 101 - 111 mmol/L   CO2 27 22 - 32 mmol/L   Glucose, Bld 243 (H) 65 - 99 mg/dL   BUN 15 6 - 20 mg/dL   Creatinine, Ser 1.42 (H) 0.61 - 1.24 mg/dL   Calcium 9.0 8.9 - 10.3 mg/dL   GFR calc non Af Amer 49 (L) >60 mL/min   GFR calc Af Amer 57 (L) >60 mL/min   Anion gap 9 5 - 15  Hemoglobin and hematocrit, blood     Status: None   Collection Time: 05/21/16 12:01 PM  Result Value Ref Range   Hemoglobin 14.6 13.0 - 17.0 g/dL   HCT 42.8 39.0 - 52.0 %  Glucose, capillary      Status: Abnormal   Collection Time: 05/21/16 12:04 PM  Result Value Ref Range   Glucose-Capillary 246 (H) 65 - 99 mg/dL   Comment 1 Notify RN    Comment 2 Document in Chart    Comment 3 Call MD NNP PA CNM   Glucose, capillary     Status: Abnormal   Collection Time: 05/21/16  2:17 PM  Result Value Ref Range   Glucose-Capillary 185 (H) 65 - 99 mg/dL   Comment 1 Notify RN    Comment 2 Document in Chart   Glucose, capillary     Status: Abnormal   Collection Time: 05/21/16  4:11 PM  Result Value Ref Range   Glucose-Capillary 188 (H) 65 - 99 mg/dL  Glucose, capillary     Status: Abnormal   Collection Time: 05/21/16  8:07 PM  Result Value Ref Range   Glucose-Capillary 205 (H) 65 - 99 mg/dL  Glucose, capillary     Status: Abnormal   Collection Time: 05/22/16 12:03 AM  Result Value Ref Range   Glucose-Capillary 141 (H) 65 - 99 mg/dL  Glucose, capillary     Status: Abnormal   Collection Time: 05/22/16  4:12 AM  Result Value Ref Range   Glucose-Capillary 138 (H) 65 -  99 mg/dL  Basic metabolic panel     Status: Abnormal   Collection Time: 05/22/16  5:08 AM  Result Value Ref Range   Sodium 138 135 - 145 mmol/L   Potassium 4.3 3.5 - 5.1 mmol/L   Chloride 104 101 - 111 mmol/L   CO2 27 22 - 32 mmol/L   Glucose, Bld 146 (H) 65 - 99 mg/dL   BUN 17 6 - 20 mg/dL   Creatinine, Ser 1.06 0.61 - 1.24 mg/dL   Calcium 9.1 8.9 - 10.3 mg/dL   GFR calc non Af Amer >60 >60 mL/min   GFR calc Af Amer >60 >60 mL/min   Anion gap 7 5 - 15  Hemoglobin and hematocrit, blood     Status: None   Collection Time: 05/22/16  5:08 AM  Result Value Ref Range   Hemoglobin 13.9 13.0 - 17.0 g/dL   HCT 40.2 39.0 - 52.0 %    Assessment/Plan: POD# 1 s/p robotic partial nephrectomy.  1) Ambulate, Incentive spirometry 2) Advance diet as tolerated 3) Transition to oral pain medication 4) Dulcolax suppository 5) d/c urethral catheter 6) Check JP drain for creatinine level     LOS: 1 day   Lolita Rieger 05/22/2016, 6:56 AM

## 2016-05-22 NOTE — Care Management Obs Status (Signed)
[facility] NOTIFICATION   Patient Details  Name: Joseph Bird MRN: [id number] Date of Birth: [birth year]   Medicare Observation Status Notification Given:  Yes    MahabirJuliann Pulse, RN 05/22/2016, 11:39 AM

## 2016-05-22 NOTE — Care Management Note (Signed)
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 From home.                  Action/Plan:d/c plan home.   Expected Discharge Date:                  Expected Discharge Plan:  Home/Self Care  In-House Referral:     Discharge planning Services  CM Consult  Post Acute Care Choice:    Choice offered to:     DME Arranged:    DME Agency:     HH Arranged:    HH Agency:     Status of Service:  In process, will continue to follow  If discussed at Long Length of Stay Meetings, dates discussed:    Additional Comments:  Dessa Phi, RN 05/22/2016, 11:39 AM

## 2016-05-23 LAB — BASIC METABOLIC PANEL
ANION GAP: 6 (ref 5–15)
BUN: 18 mg/dL (ref 6–20)
CALCIUM: 8.6 mg/dL — AB (ref 8.9–10.3)
CO2: 28 mmol/L (ref 22–32)
Chloride: 101 mmol/L (ref 101–111)
Creatinine, Ser: 1.11 mg/dL (ref 0.61–1.24)
GFR calc non Af Amer: >60 mL/min (ref >60)
Glucose, Bld: 144 mg/dL — ABNORMAL HIGH (ref 65–99)
Potassium: 4 mmol/L (ref 3.5–5.1)
Sodium: 135 mmol/L (ref 135–145)

## 2016-05-23 LAB — GLUCOSE, CAPILLARY: GLUCOSE-CAPILLARY: 132 mg/dL — AB (ref 65–99)

## 2016-05-23 MED ORDER — OXYCODONE HCL 5 MG PO TABS: 5.0000 mg | ORAL_TABLET | ORAL | 0 refills | Status: DC | PRN

## 2016-05-23 NOTE — Progress Notes (Signed)
Pt HR sustaining in the low to mid 40's. Jonette Eva notified. Continue to monitor.

## 2016-05-25 LAB — TYPE AND SCREEN
ABO/RH(D): B POS
Antibody Screen: NEGATIVE
UNIT DIVISION: 0
Unit division: 0

## 2016-10-05 NOTE — Addendum Note (Signed)
Addendum  created 10/05/16 1011 by Wynonna Fitzhenry, MD   Sign clinical note    

## 2016-10-05 NOTE — Anesthesia Postprocedure Evaluation (Signed)
Anesthesia Post Note  Patient: Joseph Bird  Procedure(s) Performed: Procedure(s) (LRB): XI ROBOTIC ASSITED PARTIAL NEPHRECTOMY (Right)     Anesthesia Post Evaluation  Last Vitals:  Vitals:   05/22/16 2323 05/23/16 0511  BP: 138/67 134/85  Pulse: (!) 42 (!) 45  Resp: 18 18  Temp: 36.9 C 37.6 C    Last Pain:  Vitals:   05/23/16 0511  TempSrc: Oral  PainSc:                  [name] S

## 2016-11-17 ENCOUNTER — Telehealth: Payer: Self-pay | Admitting: Cardiology

## 2016-11-17 NOTE — Telephone Encounter (Signed)
Pt wants to know if Dr Martinique would take him as a new pt? Pt did not want to follow Dr Geraldo Pitter to [facility].

## 2016-11-17 NOTE — Telephone Encounter (Signed)
Ok by me  Peter Martinique MD, [facility]

## 2016-11-17 NOTE — Telephone Encounter (Signed)
LMTCB regarding reason for change (will need to be OK'ed by both providers)  Message routed to Dr. Martinique, Dr. Morrie Sheldon, [place]

## 2016-11-18 NOTE — Telephone Encounter (Signed)
Returned call to patient no answer.LMTC. 

## 2016-11-19 ENCOUNTER — Telehealth: Payer: Self-pay | Admitting: Cardiology

## 2016-11-19 NOTE — Telephone Encounter (Signed)
Called patient and left a VM with my name and number to call and schedule an appointment with Dr. Martinique.

## 2016-11-19 NOTE — Telephone Encounter (Signed)
Returned call to patient no answer.Left message on personal voice mail scheduler will be calling back to schedule appointment with Dr.Jordan.

## 2016-11-26 ENCOUNTER — Ambulatory Visit (INDEPENDENT_AMBULATORY_CARE_PROVIDER_SITE_OTHER): Payer: Medicare Other | Admitting: Cardiology

## 2016-11-26 ENCOUNTER — Encounter: Payer: Self-pay | Admitting: Cardiology

## 2016-11-26 VITALS — BP 120/66 | Ht 71.0 in | Wt 199.0 lb

## 2016-11-26 DIAGNOSIS — I5022 Chronic systolic (congestive) heart failure: Secondary | ICD-10-CM | POA: Diagnosis not present

## 2016-11-26 DIAGNOSIS — E1140 Type 2 diabetes mellitus with diabetic neuropathy, unspecified: Secondary | ICD-10-CM | POA: Diagnosis not present

## 2016-11-26 DIAGNOSIS — I10 Essential (primary) hypertension: Secondary | ICD-10-CM | POA: Diagnosis not present

## 2016-11-26 DIAGNOSIS — I428 Other cardiomyopathies: Secondary | ICD-10-CM | POA: Diagnosis not present

## 2016-11-26 NOTE — Progress Notes (Signed)
Cardiology Office Note   Date:  11/26/2016   ID:  Joseph Bird, DOB 11-27-1945, MRN [id number]  PCP:  Raelyn Number, MD  Cardiologist:   Clelia Trabucco Martinique, MD   Chief Complaint  Patient presents with  . Congestive Heart Failure      History of Present Illness: Joseph Bird is a 70 y.o. male who presents to establish cardiac care. Former patient of Dr. Geraldo Pitter in [place]. He has a history of DM with neuropathy and gastroparesis, HLD,  and HTN. He has a history of nonischemic cardiomyopathy and minimal nonobstructive CAD by cardiac cath in June 2017. EF 40%. Stress Echo, Echo and Holter monitor done at that time but unable to access results. Records note history of frequent PVCs but unable to tell if this was noted on Holter or Ecg. More recently patient had an Echo one month ago in [place] and it was recommended that he see me.  Joseph Bird grew up and still lives in [place]. He operates several assisted living centers. He denies any cardiac symptoms specifically chest pain, SOB, palpitations, dizziness, syncope, orthopnea, PND or edema. He is active and enjoys swimming.  He was previously on Coreg and lisinopril. He was hospitalized in January for a partial nephrectomy. Apparently in the hospital he was noted to have bradycardia with HR down in the low 40s. Although the DC summary does not mention this there is a nurses note stating HR in 81s. Coreg was discontinued.     Past Medical History:  Diagnosis Date  . Anxiety   . Arthritis   . Bell's palsy    hx of   . Cancer [facility]) 2010   prostate, kidney cancer   . CHF (congestive heart failure) (HCC)    hx of   . Chronic nausea   . Depression   . Diabetes mellitus without complication ([place])    type II  . GERD (gastroesophageal reflux disease)   . History of kidney stones   . Hyperlipidemia   . Hypertension   . Irregular heart beat   . Neuropathy due to secondary diabetes ([place])   . Pneumonia    hx of x 2   . PONV  (postoperative nausea and vomiting)   . PVC (premature ventricular contraction)     Past Surgical History:  Procedure Laterality Date  . BACK SURGERY     x3  . CARDIAC CATHETERIZATION    . CHOLECYSTECTOMY    . EYE SURGERY     right cataract  . MAXIMUM ACCESS (MAS)POSTERIOR LUMBAR INTERBODY FUSION (PLIF) 2 LEVEL N/A 03/09/2013   Procedure: FOR MAXIMUM ACCESS (MAS) POSTERIOR LUMBAR INTERBODY FUSION (PLIF) 2 LEVEL;  Surgeon: Eustace Moore, MD;  Location: [facility];  Service: Neurosurgery;  Laterality: N/A;  POSTERIOR LUMBAR INTERBODY FUSION (PLIF) 2 LEVEL  . PROSTATECTOMY    . ROBOTIC ASSITED PARTIAL NEPHRECTOMY Right 05/21/2016   Procedure: XI ROBOTIC ASSITED PARTIAL NEPHRECTOMY;  Surgeon: Raynelle Bring, MD;  Location: [facility];  Service: Urology;  Laterality: Right;  clamp time: 16 minutes  . TONSILLECTOMY       Current Outpatient Prescriptions  Medication Sig Dispense Refill  . ALPRAZolam (XANAX XR) 0.5 MG 24 hr tablet Take 0.5 mg by mouth at bedtime.     . calcium-vitamin D (OSCAL WITH D) 500-200 MG-UNIT per tablet Take 1 tablet by mouth daily with breakfast.    . Cholecalciferol (VITAMIN D) 2000 UNITS CAPS Take 2,000 Units by mouth daily.    Marland Kitchen  gabapentin (NEURONTIN) 600 MG tablet Take 600 mg by mouth daily as needed.     Marland Kitchen glipiZIDE (GLUCOTROL) 5 MG tablet Take 2.5 mg by mouth 2 (two) times daily before a meal.    . lansoprazole (PREVACID) 15 MG capsule Take 15 mg by mouth every Monday, Wednesday, and Friday.     Marland Kitchen lisinopril (PRINIVIL,ZESTRIL) 20 MG tablet Take 10 mg by mouth every evening.     . metFORMIN (GLUCOPHAGE) 1000 MG tablet Take 1,000 mg by mouth 2 (two) times daily with a meal.    . morphine (MS CONTIN) 15 MG 12 hr tablet Take 15 mg by mouth every 12 (twelve) hours. Verified with [employer]     . Multiple Vitamin (MULTIVITAMIN WITH MINERALS) TABS tablet Take 1 tablet by mouth daily.    . ondansetron (ZOFRAN) 4 MG tablet Take 4 mg by mouth every 8 (eight) hours as  needed for nausea. Takes 1 tablet in the morning and then again only if needed    . pravastatin (PRAVACHOL) 10 MG tablet Take 10 mg by mouth at bedtime.    . Travoprost, BAK Free, (TRAVATAN) 0.004 % SOLN ophthalmic solution Place 1 drop into the right eye at bedtime.     No current facility-administered medications for this visit.     Allergies:   Demerol [meperidine]    Social History:  The patient  reports that he has never smoked. He has never used smokeless tobacco. He reports that he does not drink alcohol or use drugs.   Family History:  The patient's family history includes Alzheimer's disease in his mother; Heart attack in his brother; Heart disease in his brother; Stroke in his father.    ROS:  Please see the history of present illness.   Otherwise, review of systems are positive for episodes of marked shivering that may last up to 30 minutes.   All other systems are reviewed and negative.    PHYSICAL EXAM: VS:  BP 120/66   Ht 5\' 11"  (1.803 m)   Wt 199 lb (90.3 kg)   BMI 27.75 kg/m  , BMI Body mass index is 27.75 kg/m. GEN: Well nourished, well developed, in no acute distress  HEENT: normal  Neck: no JVD, carotid bruits, or masses Cardiac: RRR with frequent extrasystoles; no murmurs, rubs, or gallops,no edema  Respiratory:  clear to auscultation bilaterally, normal work of breathing GI: soft, nontender, nondistended, + BS MS: no deformity or atrophy  Skin: warm and dry, no rash Neuro:  Strength and sensation are intact Psych: euthymic mood, full affect   EKG:  EKG is ordered today. The ekg ordered today demonstrates NSR with frequent PVCs, rate 69. LAFB. I have personally reviewed and interpreted this study.    Recent Labs: 05/20/2016: ALT 19; Platelets 215 05/22/2016: Hemoglobin 13.9 05/23/2016: BUN 18; Creatinine, Ser 1.11; Potassium 4.0; Sodium 135    Lipid Panel No results found for: CHOL, TRIG, HDL, CHOLHDL, VLDL, LDLCALC, LDLDIRECT    Wt Readings from  Last 3 Encounters:  11/26/16 199 lb (90.3 kg)  05/21/16 196 lb (88.9 kg)  05/20/16 198 lb (89.8 kg)      Other studies Reviewed: Additional studies/ records that were reviewed today include: see HPI.   ASSESSMENT AND PLAN:  1.  Chronic systolic CHF secondary to nonischemic cardiomyopathy. This is most likely due to his DM and HTN. It is possible that if his PVC burden is high that this could negatively impact his LV function. At this point the  only information I have access to is his cardiac cath report. I will request a copy of all other studies to include prior Echos, stress Echos, and Holter report. For the time being we will continue lisinopril only. Previous issues on Coreg with bradycardia. Consider further titration of lisinopril or addition of aldactone depending on results of prior studies. If holter shows a high PVC burden we make consider antiarrhythmic drug therapy. Fortunately for now he is asymptomatic. I will follow up in 6 weeks after we get records. 2. DM type 2 with Neuropathy, gastroparesis. Managed per primary care 3. HTN. Controlled. 4. Hyperlipidemia on statin.    Current medicines are reviewed at length with the patient today.  The patient does not have concerns regarding medicines.  The following changes have been made:  no change  Labs/ tests ordered today include: none  Orders Placed This Encounter  Procedures  . EKG 12-Lead     Disposition:   FU with me in 6 weeks  Signed, Kiev Labrosse Martinique, MD  11/26/2016 5:24 PM    [facility] [address], [place], [place], [address] Phone [phone], Fax [phone]

## 2016-11-26 NOTE — Patient Instructions (Signed)
We will obtain all your cardiac records from [facility].  Continue your current medication  I will see you in about 6 weeks.

## 2016-12-08 ENCOUNTER — Telehealth: Payer: Self-pay | Admitting: Cardiology

## 2016-12-08 NOTE — Telephone Encounter (Incomplete)
ROI faxed to Dr. Wyatt Mage Haque's office. ab

## 2016-12-08 NOTE — Telephone Encounter (Incomplete)
ROI faxed to Dr. Reita Cliche. Revankar. ab

## 2016-12-16 ENCOUNTER — Encounter: Payer: Self-pay | Admitting: Cardiology

## 2017-01-03 NOTE — Progress Notes (Addendum)
Cardiology Office Note   Date:  01/07/2017   ID:  Joseph Bird, DOB Nov 15, 1945, MRN [id number]  PCP:  Raelyn Number, MD  Cardiologist:   Adaijah Endres Martinique, MD   Chief Complaint  Patient presents with  . Congestive Heart Failure      History of Present Illness: Joseph Bird is a 71 y.o. male who presents to establish cardiac care. Former patient of Dr. Geraldo Pitter in [place]. He has a history of DM with neuropathy and gastroparesis, HLD,  and HTN. He has a history of nonischemic cardiomyopathy and minimal nonobstructive CAD by cardiac cath in June 2017. EF 40%. Stress Echo, Echo and Holter monitor done at that time but unable to access results. Records note history of frequent PVCs but unable to tell if this was noted on Holter or Ecg. More recently patient had an Echo one month ago in [place] and it was recommended that he see me.  Joseph Bird grew up and still lives in [place]. He operates several assisted living centers. He denies any cardiac symptoms specifically chest pain, SOB, palpitations, dizziness, syncope, orthopnea, PND or edema. He is active and enjoys swimming.  He was previously on Coreg and lisinopril. He was hospitalized in January 2018 for a partial nephrectomy. Apparently in the hospital he was noted to have bradycardia with HR down in the low 40s. Although the DC summary does not mention this there is a nurses note stating HR in 63s. Coreg was discontinued.   Since his last visit he continues to be active. Swam a mile twice this week and felt great. No symptoms. I still have not received any prior records from his cardiac evaluation.    Past Medical History:  Diagnosis Date  . Anxiety   . Arthritis   . Bell's palsy    hx of   . Cancer [facility]) 2010   prostate, kidney cancer   . CHF (congestive heart failure) (HCC)    hx of   . Chronic nausea   . Depression   . Diabetes mellitus without complication ([place])    type II  . GERD (gastroesophageal reflux disease)   .  History of kidney stones   . Hyperlipidemia   . Hypertension   . Irregular heart beat   . Neuropathy due to secondary diabetes ([place])   . Pneumonia    hx of x 2   . PONV (postoperative nausea and vomiting)   . PVC (premature ventricular contraction)     Past Surgical History:  Procedure Laterality Date  . BACK SURGERY     x3  . CARDIAC CATHETERIZATION    . CHOLECYSTECTOMY    . EYE SURGERY     right cataract  . MAXIMUM ACCESS (MAS)POSTERIOR LUMBAR INTERBODY FUSION (PLIF) 2 LEVEL N/A 03/09/2013   Procedure: FOR MAXIMUM ACCESS (MAS) POSTERIOR LUMBAR INTERBODY FUSION (PLIF) 2 LEVEL;  Surgeon: Eustace Moore, MD;  Location: [facility];  Service: Neurosurgery;  Laterality: N/A;  POSTERIOR LUMBAR INTERBODY FUSION (PLIF) 2 LEVEL  . PROSTATECTOMY    . ROBOTIC ASSITED PARTIAL NEPHRECTOMY Right 05/21/2016   Procedure: XI ROBOTIC ASSITED PARTIAL NEPHRECTOMY;  Surgeon: Raynelle Bring, MD;  Location: [facility];  Service: Urology;  Laterality: Right;  clamp time: 16 minutes  . TONSILLECTOMY       Current Outpatient Prescriptions  Medication Sig Dispense Refill  . ALPRAZolam (XANAX XR) 0.5 MG 24 hr tablet Take 0.5 mg by mouth at bedtime.     . calcium-vitamin  D (OSCAL WITH D) 500-200 MG-UNIT per tablet Take 1 tablet by mouth daily with breakfast.    . Cholecalciferol (VITAMIN D) 2000 UNITS CAPS Take 2,000 Units by mouth daily.    Marland Kitchen gabapentin (NEURONTIN) 600 MG tablet Take 600 mg by mouth daily as needed.     Marland Kitchen glipiZIDE (GLUCOTROL) 5 MG tablet Take 2.5 mg by mouth 2 (two) times daily before a meal.    . lansoprazole (PREVACID) 15 MG capsule Take 15 mg by mouth every Monday, Wednesday, and Friday.     Marland Kitchen lisinopril (PRINIVIL,ZESTRIL) 20 MG tablet Take 10 mg by mouth every evening.     . metFORMIN (GLUCOPHAGE) 1000 MG tablet Take 1,000 mg by mouth 2 (two) times daily with a meal.    . morphine (MS CONTIN) 15 MG 12 hr tablet Take 15 mg by mouth every 12 (twelve) hours. Verified with [employer]      . Multiple Vitamin (MULTIVITAMIN WITH MINERALS) TABS tablet Take 1 tablet by mouth daily.    . ondansetron (ZOFRAN) 4 MG tablet Take 4 mg by mouth every 8 (eight) hours as needed for nausea. Takes 1 tablet in the morning and then again only if needed    . pravastatin (PRAVACHOL) 10 MG tablet Take 10 mg by mouth at bedtime.    . Travoprost, BAK Free, (TRAVATAN) 0.004 % SOLN ophthalmic solution Place 1 drop into the right eye at bedtime.     No current facility-administered medications for this visit.     Allergies:   Demerol [meperidine]    Social History:  The patient  reports that he has never smoked. He has never used smokeless tobacco. He reports that he does not drink alcohol or use drugs.   Family History:  The patient's family history includes Alzheimer's disease in his mother; Heart attack in his brother; Heart disease in his brother; Stroke in his father.    ROS:  Please see the history of present illness.   Otherwise, review of systems are positive for episodes of marked shivering that may last up to 30 minutes.   All other systems are reviewed and negative.    PHYSICAL EXAM: VS:  BP 109/70   Pulse (!) 108   Ht 5\' 11"  (1.803 m)   Wt 198 lb 9.6 oz (90.1 kg)   SpO2 96%   BMI 27.70 kg/m  , BMI Body mass index is 27.7 kg/m. GEN: Well nourished, well developed, in no acute distress  HEENT: normal  Neck: no JVD, carotid bruits, or masses Cardiac: RRR with regular tachycardia. Rate 106. ; no murmurs, rubs, or gallops,no edema  Respiratory:  clear to auscultation bilaterally, normal work of breathing GI: soft, nontender, nondistended, + BS MS: no deformity or atrophy  Skin: warm and dry, no rash Neuro:  Strength and sensation are intact Psych: euthymic mood, full affect   EKG:  EKG is not ordered today.    Recent Labs: 05/20/2016: ALT 19; Platelets 215 05/22/2016: Hemoglobin 13.9 05/23/2016: BUN 18; Creatinine, Ser 1.11; Potassium 4.0; Sodium 135    Lipid Panel No  results found for: CHOL, TRIG, HDL, CHOLHDL, VLDL, LDLCALC, LDLDIRECT    Wt Readings from Last 3 Encounters:  01/07/17 198 lb 9.6 oz (90.1 kg)  11/26/16 199 lb (90.3 kg)  05/21/16 196 lb (88.9 kg)      Other studies Reviewed: Additional studies/ records that were reviewed today include: see HPI.   ASSESSMENT AND PLAN:  1.  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 This is most likely due to his DM and HTN. It is possible that if his PVC burden is high that this could negatively impact his LV function. At this point the only information I have access to is his prior cardiac cath report. I will again request a copy of all other studies to include prior Echos, stress Echos, and Holter report. We will go ahead and update an Echocardiogram. For the time being we will continue lisinopril only. Previous issues on Coreg with bradycardia. Consider further titration of lisinopril or addition of aldactone depending on results of Echo. Could consider resuming Beta blocker at  Low dose. If holter shows a high PVC burden we make consider antiarrhythmic drug therapy. Fortunately for now he is asymptomatic. I will follow up in 3 months. 2. DM type 2 with Neuropathy, gastroparesis. Managed per primary care 3. HTN. Controlled. 4. Hyperlipidemia on statin.    Current medicines are reviewed at length with the patient today.  The patient does not have concerns regarding medicines.  The following changes have been made:  no change  Labs/ tests ordered today include: none  No orders of the defined types were placed in this encounter.    Disposition:   FU with me in 3 months  Signed, Canaan Holzer Martinique, MD  01/07/2017 2:33 PM    [facility] [address] [place], [place], [address] Phone [phone], Fax [phone]   Addendum: I did receive records from prior cardiac evaluation. There was a stress Echo dated 10/02/15. Report is difficult to read. It mentions  anteroapical ischemia and diffuse hypokinesis. I do not see EF reported. Cath report as noted above.  Holter monitor report from 10/17/15: this showed frequent PVCs with couplets and two 3 beat runs of PVCs. By report there were 855 ventricular ectopic beats/hour or 18.3% of beats.  Echo dated 04/16/16 reported EF 55-60% with moderate LVH. Normal valves. Normal wall motion.   Brooke Steinhilber Martinique MD, [facility] 01/07/2017 5:21 PM

## 2017-01-07 ENCOUNTER — Ambulatory Visit (INDEPENDENT_AMBULATORY_CARE_PROVIDER_SITE_OTHER): Payer: Medicare Other | Admitting: Cardiology

## 2017-01-07 ENCOUNTER — Encounter: Payer: Self-pay | Admitting: Cardiology

## 2017-01-07 VITALS — BP 109/70 | HR 108 | Ht 71.0 in | Wt 198.6 lb

## 2017-01-07 DIAGNOSIS — I5022 Chronic systolic (congestive) heart failure: Secondary | ICD-10-CM

## 2017-01-07 DIAGNOSIS — I428 Other cardiomyopathies: Secondary | ICD-10-CM | POA: Diagnosis not present

## 2017-01-07 DIAGNOSIS — I10 Essential (primary) hypertension: Secondary | ICD-10-CM | POA: Diagnosis not present

## 2017-01-07 NOTE — Patient Instructions (Signed)
We will schedule you for an echocardiogram  Continue your current medication for now.   I will continue to try and access your old records.

## 2017-01-07 NOTE — Addendum Note (Signed)
Addended by: Kathyrn Lass on: 01/07/2017 02:45 PM   Modules accepted: Orders

## 2017-01-13 ENCOUNTER — Encounter: Payer: Self-pay | Admitting: Cardiology

## 2017-01-19 ENCOUNTER — Ambulatory Visit (HOSPITAL_COMMUNITY): Payer: Medicare Other | Attending: Cardiology

## 2017-01-19 ENCOUNTER — Other Ambulatory Visit: Payer: Self-pay

## 2017-01-19 DIAGNOSIS — I429 Cardiomyopathy, unspecified: Secondary | ICD-10-CM | POA: Diagnosis not present

## 2017-01-19 DIAGNOSIS — I509 Heart failure, unspecified: Secondary | ICD-10-CM | POA: Insufficient documentation

## 2017-01-19 DIAGNOSIS — E119 Type 2 diabetes mellitus without complications: Secondary | ICD-10-CM | POA: Diagnosis not present

## 2017-01-19 DIAGNOSIS — I428 Other cardiomyopathies: Secondary | ICD-10-CM | POA: Diagnosis not present

## 2017-01-19 DIAGNOSIS — I493 Ventricular premature depolarization: Secondary | ICD-10-CM | POA: Diagnosis not present

## 2017-01-19 DIAGNOSIS — I10 Essential (primary) hypertension: Secondary | ICD-10-CM | POA: Insufficient documentation

## 2017-01-19 DIAGNOSIS — I5022 Chronic systolic (congestive) heart failure: Secondary | ICD-10-CM

## 2017-01-19 DIAGNOSIS — I110 Hypertensive heart disease with heart failure: Secondary | ICD-10-CM | POA: Insufficient documentation

## 2017-01-20 ENCOUNTER — Ambulatory Visit (HOSPITAL_COMMUNITY)
Admission: RE | Admit: 2017-01-20 | Discharge: 2017-01-20 | Disposition: A | Discharge status: Home / Self Care | Payer: Medicare Other | Source: Ambulatory Visit | Attending: Urology | Admitting: Urology

## 2017-01-20 ENCOUNTER — Other Ambulatory Visit (HOSPITAL_COMMUNITY): Payer: Self-pay | Admitting: Urology

## 2017-01-20 DIAGNOSIS — Q766 Other congenital malformations of ribs: Secondary | ICD-10-CM | POA: Insufficient documentation

## 2017-01-20 DIAGNOSIS — M47814 Spondylosis without myelopathy or radiculopathy, thoracic region: Secondary | ICD-10-CM | POA: Insufficient documentation

## 2017-01-20 DIAGNOSIS — C641 Malignant neoplasm of right kidney, except renal pelvis: Secondary | ICD-10-CM | POA: Diagnosis not present

## 2017-04-13 ENCOUNTER — Ambulatory Visit: Payer: Medicare Other | Admitting: Cardiology

## 2017-06-08 NOTE — Progress Notes (Deleted)
Cardiology Office Note   Date:  06/08/2017   ID:  [name], DOB 1945/11/16, MRN [id number]  PCP:  Joseph Number, MD  Cardiologist:   Peter Martinique, MD   No chief complaint on file.     History of Present Illness: Joseph Bird is a 72 y.o. male who presents to establish cardiac care. Former patient of Dr. Geraldo Bird in [place]. He has a history of DM with neuropathy and gastroparesis, HLD,  and HTN. He has a history of nonischemic cardiomyopathy and minimal nonobstructive CAD by cardiac cath in June 2017. EF 40%. There was a stress Echo dated 10/02/15. Report is difficult to read. It mentions anteroapical ischemia and diffuse hypokinesis. I do not see EF reported. Cath report as noted above. Holter monitor report from 10/17/15: this showed frequent PVCs with couplets and two 3 beat runs of PVCs. By report there were 855 ventricular ectopic beats/hour or 18.3% of beats. Echo dated 04/16/16 reported EF 55-60% with moderate LVH. Normal valves. Normal wall motion.   Mr [name] grew up and still lives in [place]. He operates several assisted living centers. He denies any cardiac symptoms specifically chest pain, SOB, palpitations, dizziness, syncope, orthopnea, PND or edema. He is active and enjoys swimming.  He was previously on Coreg and lisinopril. He was hospitalized in January 2018 for a partial nephrectomy. Apparently in the hospital he was noted to have bradycardia with HR down in the low 40s. Although the DC summary does not mention this there is a nurses note stating HR in 71s. Coreg was discontinued.   Since his last visit he continues to be active. Swam a mile twice this week and felt great. No symptoms. I still have not received any prior records from his cardiac evaluation.    Past Medical History:  Diagnosis Date  . Anxiety   . Arthritis   . Bell's palsy    hx of   . Cancer [facility] 2010   prostate, kidney cancer   . CHF (congestive heart failure) (HCC)    hx of   .  Chronic nausea   . Depression   . Diabetes mellitus without complication ([place])    type II  . GERD (gastroesophageal reflux disease)   . History of kidney stones   . Hyperlipidemia   . Hypertension   . Irregular heart beat   . Neuropathy due to secondary diabetes ([place])   . Pneumonia    hx of x 2   . PONV (postoperative nausea and vomiting)   . PVC (premature ventricular contraction)     Past Surgical History:  Procedure Laterality Date  . BACK SURGERY     x3  . CARDIAC CATHETERIZATION    . CHOLECYSTECTOMY    . EYE SURGERY     right cataract  . MAXIMUM ACCESS (MAS)POSTERIOR LUMBAR INTERBODY FUSION (PLIF) 2 LEVEL N/A 03/09/2013   Procedure: FOR MAXIMUM ACCESS (MAS) POSTERIOR LUMBAR INTERBODY FUSION (PLIF) 2 LEVEL;  Surgeon: Joseph Moore, MD;  Location: [facility];  Service: Neurosurgery;  Laterality: N/A;  POSTERIOR LUMBAR INTERBODY FUSION (PLIF) 2 LEVEL  . PROSTATECTOMY    . ROBOTIC ASSITED PARTIAL NEPHRECTOMY Right 05/21/2016   Procedure: XI ROBOTIC ASSITED PARTIAL NEPHRECTOMY;  Surgeon: Joseph Bring, MD;  Location: [facility];  Service: Urology;  Laterality: Right;  clamp time: 16 minutes  . TONSILLECTOMY       Current Outpatient Medications  Medication Sig Dispense Refill  . ALPRAZolam (XANAX XR) 0.5 MG  24 hr tablet Take 0.5 mg by mouth at bedtime.     . calcium-vitamin D (OSCAL WITH D) 500-200 MG-UNIT per tablet Take 1 tablet by mouth daily with breakfast.    . Cholecalciferol (VITAMIN D) 2000 UNITS CAPS Take 2,000 Units by mouth daily.    Marland Kitchen gabapentin (NEURONTIN) 600 MG tablet Take 600 mg by mouth daily as needed.     Marland Kitchen glipiZIDE (GLUCOTROL) 5 MG tablet Take 2.5 mg by mouth 2 (two) times daily before a meal.    . lansoprazole (PREVACID) 15 MG capsule Take 15 mg by mouth every Monday, Wednesday, and Friday.     Marland Kitchen lisinopril (PRINIVIL,ZESTRIL) 20 MG tablet Take 10 mg by mouth every evening.     . metFORMIN (GLUCOPHAGE) 1000 MG tablet Take 1,000 mg by mouth 2 (two) times daily  with a meal.    . morphine (MS CONTIN) 15 MG 12 hr tablet Take 15 mg by mouth every 12 (twelve) hours. Verified with [employer]     . Multiple Vitamin (MULTIVITAMIN WITH MINERALS) TABS tablet Take 1 tablet by mouth daily.    . ondansetron (ZOFRAN) 4 MG tablet Take 4 mg by mouth every 8 (eight) hours as needed for nausea. Takes 1 tablet in the morning and then again only if needed    . pravastatin (PRAVACHOL) 10 MG tablet Take 10 mg by mouth at bedtime.    . Travoprost, BAK Free, (TRAVATAN) 0.004 % SOLN ophthalmic solution Place 1 drop into the right eye at bedtime.     No current facility-administered medications for this visit.     Allergies:   Demerol [meperidine]    Social History:  The patient  reports that  has never smoked. he has never used smokeless tobacco. He reports that he does not drink alcohol or use drugs.   Family History:  The patient's family history includes Alzheimer's disease in his mother; Heart attack in his brother; Heart disease in his brother; Stroke in his father.    ROS:  Please see the history of present illness.   Otherwise, review of systems are positive for episodes of marked shivering that may last up to 30 minutes.   All other systems are reviewed and negative.    PHYSICAL EXAM: VS:  There were no vitals taken for this visit. , BMI There is no height or weight on file to calculate BMI. GEN: Well nourished, well developed, in no acute distress  HEENT: normal  Neck: no JVD, carotid bruits, or masses Cardiac: RRR with regular tachycardia. Rate 106. ; no murmurs, rubs, or gallops,no edema  Respiratory:  clear to auscultation bilaterally, normal work of breathing GI: soft, nontender, nondistended, + BS MS: no deformity or atrophy  Skin: warm and dry, no rash Neuro:  Strength and sensation are intact Psych: euthymic mood, full affect   EKG:  EKG is not ordered today.    Recent Labs: No results found for requested labs within last 8760 hours.      Lipid Panel No results found for: CHOL, TRIG, HDL, CHOLHDL, VLDL, LDLCALC, LDLDIRECT    Wt Readings from Last 3 Encounters:  01/07/17 198 lb 9.6 oz (90.1 kg)  11/26/16 199 lb (90.3 kg)  05/21/16 196 lb (88.9 kg)      Other studies Reviewed: Additional studies/ records that were reviewed today include: see HPI. Labs dated 05/20/16; A1c 7.6%.  Dated 01/12/17; normal chemistry panel.  ASSESSMENT AND PLAN:  1.  Chronic systolic CHF secondary to nonischemic cardiomyopathy.  This is most likely due to his DM and HTN. It is possible that if his PVC burden is high that this could negatively impact his LV function. At this point the only information I have access to is his prior cardiac cath report. I will again request a copy of all other studies to include prior Echos, stress Echos, and Holter report. We will go ahead and update an Echocardiogram. For the time being we will continue lisinopril only. Previous issues on Coreg with bradycardia. Consider further titration of lisinopril or addition of aldactone depending on results of Echo. Could consider resuming Beta blocker at  Low dose. If holter shows a high PVC burden we make consider antiarrhythmic drug therapy. Fortunately for now he is asymptomatic. I will follow up in 3 months. 2. DM type 2 with Neuropathy, gastroparesis. Managed per primary care 3. HTN. Controlled. 4. Hyperlipidemia on statin.    Current medicines are reviewed at length with the patient today.  The patient does not have concerns regarding medicines.  The following changes have been made:  no change  Labs/ tests ordered today include: none  No orders of the defined types were placed in this encounter.    Disposition:   FU with me in 3 months  Signed, Peter Martinique, MD  06/08/2017 7:37 AM    [facility] [address] [place], [place], [address] Phone [phone], Fax [phone]

## 2017-06-09 ENCOUNTER — Ambulatory Visit: Payer: Medicare Other | Admitting: Cardiology

## 2017-06-28 ENCOUNTER — Ambulatory Visit: Payer: Medicare Other | Admitting: Cardiology

## 2017-07-29 NOTE — Progress Notes (Deleted)
Cardiology Office Note   Date:  07/29/2017   ID:  Joseph Bird, DOB 1946-01-01, MRN [id number]  PCP:  Raelyn Number, MD  Cardiologist:   Breona Cherubin Martinique, MD   No chief complaint on file.     History of Present Illness: Joseph Bird is a 72 y.o. male who presents to establish cardiac care. Former patient of Dr. Geraldo Pitter in [place]. He has a history of DM with neuropathy and gastroparesis, HLD,  and HTN. He has a history of nonischemic cardiomyopathy and minimal nonobstructive CAD by cardiac cath in June 2017. EF 40%. Stress Echo, Echo and Holter monitor done at that time.  There was a stress Echo dated 10/02/15. Report is difficult to read. It mentions anteroapical ischemia and diffuse hypokinesis. I do not see EF reported. Holter monitor report from 10/17/15: this showed frequent PVCs with couplets and two 3 beat runs of PVCs. By report there were 855 ventricular ectopic beats/hour or 18.3% of beats.  Echo dated 04/16/16 reported EF 55-60% with moderate LVH. Normal valves. Normal wall motion.   Mr Kernen grew up and still lives in [place]. He operates several assisted living centers. He denies any cardiac symptoms specifically chest pain, SOB, palpitations, dizziness, syncope, orthopnea, PND or edema. He is active and enjoys swimming.  He was previously on Coreg and lisinopril. He was hospitalized in January 2018 for a partial nephrectomy. Apparently in the hospital he was noted to have bradycardia with HR down in the low 40s. Although the DC summary does not mention this there is a nurses note stating HR in 6s. Coreg was discontinued.   Since his last visit he continues to be active. Swam a mile twice this week and felt great. No symptoms. I still have not received any prior records from his cardiac evaluation.    Past Medical History:  Diagnosis Date  . Anxiety   . Arthritis   . Bell's palsy    hx of   . Cancer [facility]) 2010   prostate, kidney cancer   . CHF (congestive heart  failure) (HCC)    hx of   . Chronic nausea   . Depression   . Diabetes mellitus without complication ([place])    type II  . GERD (gastroesophageal reflux disease)   . History of kidney stones   . Hyperlipidemia   . Hypertension   . Irregular heart beat   . Neuropathy due to secondary diabetes ([place])   . Pneumonia    hx of x 2   . PONV (postoperative nausea and vomiting)   . PVC (premature ventricular contraction)     Past Surgical History:  Procedure Laterality Date  . BACK SURGERY     x3  . CARDIAC CATHETERIZATION    . CHOLECYSTECTOMY    . EYE SURGERY     right cataract  . MAXIMUM ACCESS (MAS)POSTERIOR LUMBAR INTERBODY FUSION (PLIF) 2 LEVEL N/A 03/09/2013   Procedure: FOR MAXIMUM ACCESS (MAS) POSTERIOR LUMBAR INTERBODY FUSION (PLIF) 2 LEVEL;  Surgeon: Eustace Moore, MD;  Location: [place] [facility];  Service: Neurosurgery;  Laterality: N/A;  POSTERIOR LUMBAR INTERBODY FUSION (PLIF) 2 LEVEL  . PROSTATECTOMY    . ROBOTIC ASSITED PARTIAL NEPHRECTOMY Right 05/21/2016   Procedure: XI ROBOTIC ASSITED PARTIAL NEPHRECTOMY;  Surgeon: Raynelle Bring, MD;  Location: [facility];  Service: Urology;  Laterality: Right;  clamp time: 16 minutes  . TONSILLECTOMY       Current Outpatient Medications  Medication Sig Dispense Refill  .  ALPRAZolam (XANAX XR) 0.5 MG 24 hr tablet Take 0.5 mg by mouth at bedtime.     . calcium-vitamin D (OSCAL WITH D) 500-200 MG-UNIT per tablet Take 1 tablet by mouth daily with breakfast.    . Cholecalciferol (VITAMIN D) 2000 UNITS CAPS Take 2,000 Units by mouth daily.    Marland Kitchen gabapentin (NEURONTIN) 600 MG tablet Take 600 mg by mouth daily as needed.     Marland Kitchen glipiZIDE (GLUCOTROL) 5 MG tablet Take 2.5 mg by mouth 2 (two) times daily before a meal.    . lansoprazole (PREVACID) 15 MG capsule Take 15 mg by mouth every Monday, Wednesday, and Friday.     Marland Kitchen lisinopril (PRINIVIL,ZESTRIL) 20 MG tablet Take 10 mg by mouth every evening.     . metFORMIN (GLUCOPHAGE) 1000 MG tablet Take 1,000  mg by mouth 2 (two) times daily with a meal.    . morphine (MS CONTIN) 15 MG 12 hr tablet Take 15 mg by mouth every 12 (twelve) hours. Verified with [employer]     . Multiple Vitamin (MULTIVITAMIN WITH MINERALS) TABS tablet Take 1 tablet by mouth daily.    . ondansetron (ZOFRAN) 4 MG tablet Take 4 mg by mouth every 8 (eight) hours as needed for nausea. Takes 1 tablet in the morning and then again only if needed    . pravastatin (PRAVACHOL) 10 MG tablet Take 10 mg by mouth at bedtime.    . Travoprost, BAK Free, (TRAVATAN) 0.004 % SOLN ophthalmic solution Place 1 drop into the right eye at bedtime.     No current facility-administered medications for this visit.     Allergies:   Demerol [meperidine]    Social History:  The patient  reports that he has never smoked. He has never used smokeless tobacco. He reports that he does not drink alcohol or use drugs.   Family History:  The patient's family history includes Alzheimer's disease in his mother; Heart attack in his brother; Heart disease in his brother; Stroke in his father.    ROS:  Please see the history of present illness.   Otherwise, review of systems are positive for episodes of marked shivering that may last up to 30 minutes.   All other systems are reviewed and negative.    PHYSICAL EXAM: VS:  There were no vitals taken for this visit. , BMI There is no height or weight on file to calculate BMI. GEN: Well nourished, well developed, in no acute distress  HEENT: normal  Neck: no JVD, carotid bruits, or masses Cardiac: RRR with regular tachycardia. Rate 106. ; no murmurs, rubs, or gallops,no edema  Respiratory:  clear to auscultation bilaterally, normal work of breathing GI: soft, nontender, nondistended, + BS MS: no deformity or atrophy  Skin: warm and dry, no rash Neuro:  Strength and sensation are intact Psych: euthymic mood, full affect   EKG:  EKG is not ordered today.    Recent Labs: Lab Results  Component  Value Date   WBC 8.4 05/20/2016   HGB 13.9 05/22/2016   HCT 40.2 05/22/2016   PLT 215 05/20/2016   GLUCOSE 144 (H) 05/23/2016   ALT 19 05/20/2016   AST 21 05/20/2016   NA 135 05/23/2016   K 4.0 05/23/2016   CL 101 05/23/2016   CREATININE 1.11 05/23/2016   BUN 18 05/23/2016   CO2 28 05/23/2016   INR 0.84 03/03/2013   HGBA1C 7.6 (H) 05/20/2016       Lipid Panel No results found  for: CHOL, TRIG, HDL, CHOLHDL, VLDL, LDLCALC, LDLDIRECT    Wt Readings from Last 3 Encounters:  01/07/17 198 lb 9.6 oz (90.1 kg)  11/26/16 199 lb (90.3 kg)  05/21/16 196 lb (88.9 kg)      Other studies Reviewed: Additional studies/ records that were reviewed today include:   Echo 01/19/17: Study Conclusions  - Left ventricle: The cavity size was normal. Wall thickness was   normal. Systolic function was normal. The estimated ejection   fraction was in the range of 55% to 60%. Wall motion was normal;   there were no regional wall motion abnormalities. Doppler   parameters are consistent with abnormal left ventricular   relaxation (grade 1 diastolic dysfunction). - Ascending aorta: The ascending aorta was mildly dilated. - Mitral valve: Calcified annulus.  Impressions:  - Normal LV systolic function; mild diastolic dysfunction; mildly   dilated ascending aorta.   ASSESSMENT AND PLAN:  1.  Chronic systolic CHF secondary to nonischemic cardiomyopathy. This is most likely due to his DM and HTN. It is possible that if his PVC burden is high that this could negatively impact his LV function. At this point the only information I have access to is his prior cardiac cath report. I will again request a copy of all other studies to include prior Echos, stress Echos, and Holter report. We will go ahead and update an Echocardiogram. For the time being we will continue lisinopril only. Previous issues on Coreg with bradycardia. Consider further titration of lisinopril or addition of aldactone depending  on results of Echo. Could consider resuming Beta blocker at  Low dose. If holter shows a high PVC burden we make consider antiarrhythmic drug therapy. Fortunately for now he is asymptomatic. I will follow up in 3 months. 2. PVCs 3.  DM type 2 with Neuropathy, gastroparesis. Managed per primary care 4. HTN. Controlled. 5. Hyperlipidemia on statin.    Current medicines are reviewed at length with the patient today.  The patient does not have concerns regarding medicines.  The following changes have been made:  no change  Labs/ tests ordered today include: none  No orders of the defined types were placed in this encounter.    Disposition:   FU with me in 3 months  Signed, Taishaun Levels Martinique, MD  07/29/2017 7:45 AM    [facility] [address], [place], [place], [address] Phone [phone], Fax [phone]   Addendum: I did receive records from prior cardiac evaluation. There was a stress Echo dated 10/02/15. Report is difficult to read. It mentions anteroapical ischemia and diffuse hypokinesis. I do not see EF reported. Cath report as noted above.  Holter monitor report from 10/17/15: this showed frequent PVCs with couplets and two 3 beat runs of PVCs. By report there were 855 ventricular ectopic beats/hour or 18.3% of beats.  Echo dated 04/16/16 reported EF 55-60% with moderate LVH. Normal valves. Normal wall motion.   Lianette Broussard Martinique MD, [facility] 07/29/2017 7:45 AM

## 2017-07-30 ENCOUNTER — Ambulatory Visit: Payer: Medicare Other | Admitting: Cardiology

## 2017-08-12 ENCOUNTER — Ambulatory Visit (HOSPITAL_COMMUNITY)
Admission: RE | Admit: 2017-08-12 | Discharge: 2017-08-12 | Disposition: A | Discharge status: Home / Self Care | Payer: Medicare Other | Source: Ambulatory Visit | Attending: Urology | Admitting: Urology

## 2017-08-12 ENCOUNTER — Other Ambulatory Visit (HOSPITAL_COMMUNITY): Payer: Self-pay | Admitting: Urology

## 2017-08-12 DIAGNOSIS — C641 Malignant neoplasm of right kidney, except renal pelvis: Secondary | ICD-10-CM | POA: Diagnosis not present

## 2017-09-07 ENCOUNTER — Other Ambulatory Visit: Payer: Self-pay | Admitting: Internal Medicine

## 2017-09-07 DIAGNOSIS — K769 Liver disease, unspecified: Secondary | ICD-10-CM

## 2017-09-11 ENCOUNTER — Ambulatory Visit
Admission: RE | Admit: 2017-09-11 | Discharge: 2017-09-11 | Disposition: A | Discharge status: Home / Self Care | Payer: Medicare Other | Source: Ambulatory Visit | Attending: Internal Medicine | Admitting: Internal Medicine

## 2017-09-11 DIAGNOSIS — K769 Liver disease, unspecified: Secondary | ICD-10-CM

## 2017-09-11 MED ORDER — GADOBENATE DIMEGLUMINE 529 MG/ML IV SOLN
18.0000 mL | Freq: Once | INTRAVENOUS | Status: AC | PRN
  Administered 2017-09-11: 18 mL via INTRAVENOUS

## 2017-09-13 NOTE — Progress Notes (Signed)
Cardiology Office Note   Date:  09/16/2017   ID:  Joseph Bird, DOB [birth year], MRN [id number]  PCP:  Raelyn Number, MD  Cardiologist:   Peter Martinique, MD   Chief Complaint  Patient presents with  . Follow-up    3 months;      History of Present Illness: Joseph Bird is a 72 y.o. male who is seen for follow up cardiomyopathy. He has a history of DM with neuropathy and gastroparesis, HLD,  and HTN. He has a history of nonischemic cardiomyopathy and minimal nonobstructive CAD by cardiac cath in June 2017. EF 40%. There was a stress Echo dated 10/02/15. Report is difficult to read. It mentions anteroapical ischemia and diffuse hypokinesis. I do not see EF reported. Cath report as noted above. Holter monitor report from 10/17/15: this showed frequent PVCs with couplets and two 3 beat runs of PVCs. By report there were 855 ventricular ectopic beats/hour or 18.3% of beats.  Echo dated 04/16/16 reported EF 55-60% with moderate LVH. Normal valves. Normal wall motion.  Joseph Bird grew up and still lives in [place]. He operates several assisted living centers.   He continues to deny any cardiac symptoms specifically chest pain, SOB, palpitations, dizziness, syncope, orthopnea, PND or edema. He is active and enjoys swimming.  He was previously on Coreg and lisinopril. He was hospitalized in January 2018 for a partial nephrectomy. Apparently in the hospital he was noted to have bradycardia with HR down in the low 40s.  Coreg was discontinued.     Past Medical History:  Diagnosis Date  . Anxiety   . Arthritis   . Bell's palsy    hx of   . Cancer [facility]) 2010   prostate, kidney cancer   . CHF (congestive heart failure) (HCC)    hx of   . Chronic nausea   . Depression   . Diabetes mellitus without complication ([place])    type II  . GERD (gastroesophageal reflux disease)   . History of kidney stones   . Hyperlipidemia   . Hypertension   . Irregular heart beat   . Neuropathy due to  secondary diabetes ([place])   . Pneumonia    hx of x 2   . PONV (postoperative nausea and vomiting)   . PVC (premature ventricular contraction)     Past Surgical History:  Procedure Laterality Date  . BACK SURGERY     x3  . CARDIAC CATHETERIZATION    . CHOLECYSTECTOMY    . EYE SURGERY     right cataract  . MAXIMUM ACCESS (MAS)POSTERIOR LUMBAR INTERBODY FUSION (PLIF) 2 LEVEL N/A 03/09/2013   Procedure: FOR MAXIMUM ACCESS (MAS) POSTERIOR LUMBAR INTERBODY FUSION (PLIF) 2 LEVEL;  Surgeon: Eustace Moore, MD;  Location: [place] [facility]UR[facility] OR[facility];  Service: Neurosurgery;  Laterality: N/A;  POSTERIOR LUMBAR INTERBODY FUSION (PLIF) 2 LEVEL  . PROSTATECTOMY    . ROBOTIC ASSITED PARTIAL NEPHRECTOMY Right 05/21/2016   Procedure: XI ROBOTIC ASSITED PARTIAL NEPHRECTOMY;  Surgeon: Raynelle Bring, MD;  Location: [facility];  Service: Urology;  Laterality: Right;  clamp time: 16 minutes  . TONSILLECTOMY       Current Outpatient Medications  Medication Sig Dispense Refill  . ALPRAZolam (XANAX XR) 0.5 MG 24 hr tablet Take 0.5 mg by mouth at bedtime.     . calcium-vitamin D (OSCAL WITH D) 500-200 MG-UNIT per tablet Take 1 tablet by mouth daily with breakfast.    . Cholecalciferol (VITAMIN D) 2000 UNITS  CAPS Take 2,000 Units by mouth daily.    Marland Kitchen gabapentin (NEURONTIN) 600 MG tablet Take 600 mg by mouth daily as needed.     Marland Kitchen glipiZIDE (GLUCOTROL) 5 MG tablet Take 2.5 mg by mouth 2 (two) times daily before a meal.    . lansoprazole (PREVACID) 15 MG capsule Take 15 mg by mouth every Monday, Wednesday, and Friday.     Marland Kitchen lisinopril (PRINIVIL,ZESTRIL) 20 MG tablet Take 10 mg by mouth every evening.     . metFORMIN (GLUCOPHAGE) 1000 MG tablet Take 1,000 mg by mouth 2 (two) times daily with a meal.    . morphine (MS CONTIN) 15 MG 12 hr tablet Take 15 mg by mouth every 12 (twelve) hours. Verified with [employer]     . Multiple Vitamin (MULTIVITAMIN WITH MINERALS) TABS tablet Take 1 tablet by mouth daily.    . ondansetron  (ZOFRAN) 4 MG tablet Take 4 mg by mouth every 8 (eight) hours as needed for nausea. Takes 1 tablet in the morning and then again only if needed    . pravastatin (PRAVACHOL) 10 MG tablet Take 10 mg by mouth at bedtime.    . Travoprost, BAK Free, (TRAVATAN) 0.004 % SOLN ophthalmic solution Place 1 drop into the right eye at bedtime.     No current facility-administered medications for this visit.     Allergies:   Demerol [meperidine]    Social History:  The patient  reports that he has never smoked. He has never used smokeless tobacco. He reports that he does not drink alcohol or use drugs.   Family History:  The patient's family history includes Alzheimer's disease in his mother; Heart attack in his brother; Heart disease in his brother; Stroke in his father.    ROS:  Please see the history of present illness.   Otherwise, review of systems are positive for episodes of marked shivering that may last up to 30 minutes.   All other systems are reviewed and negative.    PHYSICAL EXAM: VS:  BP 118/79   Pulse 73   Ht 5\' 11"  (1.803 m)   Wt 195 lb 12.8 oz (88.8 kg)   BMI 27.31 kg/m  , BMI Body mass index is 27.31 kg/m. GENERAL:  Well appearing HEENT:  PERRL, EOMI, sclera are clear. Oropharynx is clear. NECK:  No jugular venous distention, carotid upstroke brisk and symmetric, no bruits, no thyromegaly or adenopathy LUNGS:  Clear to auscultation bilaterally CHEST:  Unremarkable HEART:  RRR,  PMI not displaced or sustained,S1 and S2 within normal limits, no S3, no S4: no clicks, no rubs, no murmurs ABD:  Soft, nontender. BS +, no masses or bruits. No hepatomegaly, no splenomegaly EXT:  2 + pulses throughout, no edema, no cyanosis no clubbing SKIN:  Warm and dry.  No rashes NEURO:  Alert and oriented x 3. Cranial nerves II through XII intact. PSYCH:  Cognitively intact     EKG:  EKG is not ordered today.    Recent Labs: No results found for requested labs within last 8760 hours.     Lipid Panel No results found for: CHOL, TRIG, HDL, CHOLHDL, VLDL, LDLCALC, LDLDIRECT    Wt Readings from Last 3 Encounters:  09/16/17 195 lb 12.8 oz (88.8 kg)  01/07/17 198 lb 9.6 oz (90.1 kg)  11/26/16 199 lb (90.3 kg)    Dated 05/20/16: A1c 7.6%. Hgb 13.9.  Dated dated 06/29/17: normal CMET   Other studies Reviewed: Additional studies/ records that were reviewed  today include: see HPI.  Echo: 01/19/17:Study Conclusions  - Left ventricle: The cavity size was normal. Wall thickness was   normal. Systolic function was normal. The estimated ejection   fraction was in the range of 55% to 60%. Wall motion was normal;   there were no regional wall motion abnormalities. Doppler   parameters are consistent with abnormal left ventricular   relaxation (grade 1 diastolic dysfunction). - Ascending aorta: The ascending aorta was mildly dilated. - Mitral valve: Calcified annulus.  Impressions:  - Normal LV systolic function; mild diastolic dysfunction; mildly   dilated ascending aorta.   ASSESSMENT AND PLAN:  1.  History of Nonischemic cardiomyopathy. This is most likely due to his DM and HTN. EF has normalized and he is asymptomatic. He is on ACEi only due to bradycardia on beta blocker. No further cardiac concerns at present. Will follow up prn. 2. DM type 2 with Neuropathy, gastroparesis. Managed per primary care 3. HTN. Controlled. 4. Hyperlipidemia on statin.    Current medicines are reviewed at length with the patient today.  The patient does not have concerns regarding medicines.  The following changes have been made:  no change  Labs/ tests ordered today include: none  No orders of the defined types were placed in this encounter.    Disposition:   FU with me PRN  Signed, Peter Martinique, MD  09/16/2017 2:51 PM    [facility] [address] [place], [place], [address] Phone [phone], Fax [phone]

## 2017-09-16 ENCOUNTER — Encounter: Payer: Self-pay | Admitting: Cardiology

## 2017-09-16 ENCOUNTER — Ambulatory Visit (INDEPENDENT_AMBULATORY_CARE_PROVIDER_SITE_OTHER): Payer: Medicare Other | Admitting: Cardiology

## 2017-09-16 VITALS — BP 118/79 | HR 73 | Ht 71.0 in | Wt 195.8 lb

## 2017-09-16 DIAGNOSIS — I10 Essential (primary) hypertension: Secondary | ICD-10-CM

## 2017-09-16 DIAGNOSIS — I428 Other cardiomyopathies: Secondary | ICD-10-CM

## 2017-09-16 NOTE — Patient Instructions (Signed)
Continue your current therapy  I will see you as needed. 

## 2018-08-25 ENCOUNTER — Encounter (HOSPITAL_COMMUNITY): Payer: Self-pay | Admitting: Emergency Medicine

## 2018-08-25 ENCOUNTER — Emergency Department (HOSPITAL_COMMUNITY): Payer: Medicare Other

## 2018-08-25 ENCOUNTER — Emergency Department (HOSPITAL_COMMUNITY)
Admission: EM | Admit: 2018-08-25 | Discharge: 2018-08-25 | Disposition: A | Discharge status: Home / Self Care | Payer: Medicare Other | Attending: Emergency Medicine | Admitting: Emergency Medicine

## 2018-08-25 ENCOUNTER — Other Ambulatory Visit: Payer: Self-pay

## 2018-08-25 DIAGNOSIS — I110 Hypertensive heart disease with heart failure: Secondary | ICD-10-CM | POA: Diagnosis not present

## 2018-08-25 DIAGNOSIS — Z79899 Other long term (current) drug therapy: Secondary | ICD-10-CM | POA: Insufficient documentation

## 2018-08-25 DIAGNOSIS — Z8546 Personal history of malignant neoplasm of prostate: Secondary | ICD-10-CM | POA: Diagnosis not present

## 2018-08-25 DIAGNOSIS — I509 Heart failure, unspecified: Secondary | ICD-10-CM | POA: Insufficient documentation

## 2018-08-25 DIAGNOSIS — R799 Abnormal finding of blood chemistry, unspecified: Secondary | ICD-10-CM | POA: Diagnosis present

## 2018-08-25 DIAGNOSIS — Z85528 Personal history of other malignant neoplasm of kidney: Secondary | ICD-10-CM

## 2018-08-25 DIAGNOSIS — E119 Type 2 diabetes mellitus without complications: Secondary | ICD-10-CM | POA: Diagnosis not present

## 2018-08-25 DIAGNOSIS — Z7984 Long term (current) use of oral hypoglycemic drugs: Secondary | ICD-10-CM | POA: Insufficient documentation

## 2018-08-25 DIAGNOSIS — Z905 Acquired absence of kidney: Secondary | ICD-10-CM | POA: Diagnosis not present

## 2018-08-25 NOTE — ED Notes (Signed)
ED Provider at bedside. 

## 2018-08-25 NOTE — ED Notes (Signed)
Patient transported to X-ray 

## 2018-08-25 NOTE — ED Provider Notes (Signed)
[facility] DEPT Provider Note   CSN: [id number] Arrival date & time: 08/25/18  1127    History   Chief Complaint Chief Complaint  Patient presents with  . Abnormal Lab    HPI Joseph Bird is a 73 y.o. male.     73 yo M with no complaints.  Patient is here because he was sent by his urologist for images.  He had an outpatient CT scan done already and was sent over here for further evaluation.  Went to the x-ray department and they sent him to the ED.  He denies cough congestion shortness of breath abdominal pain vomiting or diarrhea.  The history is provided by the patient.  Abnormal Lab  Illness  Severity:  Mild Onset quality:  Gradual Duration:  2 days Timing:  Constant Progression:  Worsening Chronicity:  New Associated symptoms: no abdominal pain, no chest pain, no congestion, no diarrhea, no fever, no headaches, no myalgias, no rash, no shortness of breath and no vomiting     Past Medical History:  Diagnosis Date  . Anxiety   . Arthritis   . Bell's palsy    hx of   . Cancer [facility]) 2010   prostate, kidney cancer   . CHF (congestive heart failure) (HCC)    hx of   . Chronic nausea   . Depression   . Diabetes mellitus without complication ([place])    type II  . GERD (gastroesophageal reflux disease)   . History of kidney stones   . Hyperlipidemia   . Hypertension   . Irregular heart beat   . Neuropathy due to secondary diabetes ([place])   . Pneumonia    hx of x 2   . PONV (postoperative nausea and vomiting)   . PVC (premature ventricular contraction)     Patient Active Problem List   Diagnosis Date Noted  . Neoplasm of right kidney 05/21/2016  . Renal mass, right 05/21/2016    Past Surgical History:  Procedure Laterality Date  . BACK SURGERY     x3  . CARDIAC CATHETERIZATION    . CHOLECYSTECTOMY    . EYE SURGERY     right cataract  . MAXIMUM ACCESS (MAS)POSTERIOR LUMBAR INTERBODY FUSION (PLIF) 2 LEVEL N/A 03/09/2013    Procedure: FOR MAXIMUM ACCESS (MAS) POSTERIOR LUMBAR INTERBODY FUSION (PLIF) 2 LEVEL;  Surgeon: Eustace Moore, MD;  Location: [facility] NEURO ORS;  Service: Neurosurgery;  Laterality: N/A;  POSTERIOR LUMBAR INTERBODY FUSION (PLIF) 2 LEVEL  . PROSTATECTOMY    . ROBOTIC ASSITED PARTIAL NEPHRECTOMY Right 05/21/2016   Procedure: XI ROBOTIC ASSITED PARTIAL NEPHRECTOMY;  Surgeon: Raynelle Bring, MD;  Location: [facility];  Service: Urology;  Laterality: Right;  clamp time: 16 minutes  . TONSILLECTOMY          Home Medications    Prior to Admission medications   Medication Sig Start Date End Date Taking? Authorizing Provider  ALPRAZolam (XANAX) 0.25 MG tablet Take 0.125 mg by mouth every 6 (six) hours as needed for anxiety.  07/28/18  Yes [provider]  calcium-vitamin D (OSCAL WITH D) 500-200 MG-UNIT per tablet Take 1 tablet by mouth daily with breakfast.   Yes [provider]  Cholecalciferol (VITAMIN D) 2000 UNITS CAPS Take 2,000 Units by mouth daily.   Yes [provider]  glipiZIDE (GLUCOTROL) 5 MG tablet Take 2.5 mg by mouth 2 (two) times daily before a meal.   Yes [provider]  latanoprost (XALATAN) 0.005 %  ophthalmic solution Place 1 drop into both eyes at bedtime. 07/21/18  Yes [provider]  lisinopril (PRINIVIL,ZESTRIL) 20 MG tablet Take 10 mg by mouth every evening.    Yes [provider]  metFORMIN (GLUCOPHAGE) 1000 MG tablet Take 1,000 mg by mouth 2 (two) times daily with a meal.   Yes [provider]  morphine (MS CONTIN) 15 MG 12 hr tablet Take 15 mg by mouth every 12 (twelve) hours. Verified with [facility]    Yes [provider]  Multiple Vitamin (MULTIVITAMIN WITH MINERALS) TABS tablet Take 1 tablet by mouth daily.   Yes [provider]  pravastatin (PRAVACHOL) 40 MG tablet Take 20 mg by mouth daily.   Yes [provider]  Travoprost, BAK Free, (TRAVATAN) 0.004 % SOLN ophthalmic solution  Place 1 drop into the right eye at bedtime.   Yes [provider]    Family History Family History  Problem Relation Age of Onset  . Alzheimer's disease Mother   . Stroke Father   . Heart disease Brother   . Heart attack Brother     Social History Social History   Tobacco Use  . Smoking status: Never Smoker  . Smokeless tobacco: Never Used  Substance Use Topics  . Alcohol use: No  . Drug use: No     Allergies   Demerol [meperidine]   Review of Systems Review of Systems  Constitutional: Negative for chills and fever.  HENT: Negative for congestion and facial swelling.   Eyes: Negative for discharge and visual disturbance.  Respiratory: Negative for shortness of breath.   Cardiovascular: Negative for chest pain and palpitations.  Gastrointestinal: Negative for abdominal pain, diarrhea and vomiting.  Musculoskeletal: Negative for arthralgias and myalgias.  Skin: Negative for color change and rash.  Neurological: Negative for tremors, syncope and headaches.  Psychiatric/Behavioral: Negative for confusion and dysphoric mood.     Physical Exam Updated Vital Signs BP (!) 158/103   Pulse 80   Temp 97.8 F (36.6 C) (Oral)   Resp 18   SpO2 100%   Physical Exam Vitals signs and nursing note reviewed.  Constitutional:      Appearance: He is well-developed.  HENT:     Head: Normocephalic and atraumatic.  Eyes:     Pupils: Pupils are equal, round, and reactive to light.  Neck:     Musculoskeletal: Normal range of motion and neck supple.     Vascular: No JVD.  Cardiovascular:     Rate and Rhythm: Normal rate and regular rhythm.     Heart sounds: No murmur. No friction rub. No gallop.   Pulmonary:     Effort: No respiratory distress.     Breath sounds: No wheezing.  Abdominal:     General: There is no distension.     Tenderness: There is no guarding or rebound.  Musculoskeletal: Normal range of motion.  Skin:    Coloration: Skin is not pale.      Findings: No rash.  Neurological:     Mental Status: He is alert and oriented to person, place, and time.  Psychiatric:        Behavior: Behavior normal.      ED Treatments / Results  Labs (all labs ordered are listed, but only abnormal results are displayed) Labs Reviewed - No data to display  EKG None  Radiology No results found.  Procedures Procedures (including critical care time)  Medications Ordered in ED Medications - No data to display  Initial Impression / Assessment and Plan / ED Course  I have reviewed the triage vital signs and the nursing notes.  Pertinent labs & imaging results that were available during my care of the patient were reviewed by me and considered in my medical decision making (see chart for details).        73 yo M with no complaints.  I think he inadvertently checked into the emergency department when he was trying to find outpatient imaging.  He thinks that they sent him to the ED.  He was seen in the urologist office today and sent for an outpatient CT scan and chest x-ray.  His CT scan is already been performed I will obtain a chest x-ray.  I called the urology office and they were not aware of any other testing that need to be performed.  Discharge home.   My view of cxr without focal infiltrate or ptx.  D/c home.   12:47 PM:  I have discussed the diagnosis/risks/treatment options with the patient and believe the pt to be eligible for discharge home to follow-up with Urology, PCP. We also discussed returning to the ED immediately if new or worsening sx occur. We discussed the sx which are most concerning (e.g., sudden worsening pain, fever, inability to tolerate by mouth) that necessitate immediate return. Medications administered to the patient during their visit and any new prescriptions provided to the patient are listed below.  Medications given during this visit Medications - No data to display   The patient appears reasonably  screen and/or stabilized for discharge and I doubt any other medical condition or other [facility] requiring further screening, evaluation, or treatment in the ED at this time prior to discharge.    Final Clinical Impressions(s) / ED Diagnoses   Final diagnoses:  History of renal cell carcinoma    ED Discharge Orders    None       Deno Etienne, DO 08/25/18 1247

## 2018-08-25 NOTE — ED Triage Notes (Signed)
Patient poor historian. Reports sent from urology sent for chest XR. Patient denies cough, SOB, fever, chest pain, N/V/D. Denies urinary sx.

## 2020-12-17 ENCOUNTER — Telehealth: Payer: Self-pay

## 2020-12-17 NOTE — Telephone Encounter (Signed)
Received a call from [place] with [facility] calling to report patient had abd ultrasound on 12/09/2020 in their office revealed AAA 8.2.CTA is scheduled 12/30/20.Wife requested patient see Dr.Jordan before 10/22.Advised I will speak to Dr.Jordan tomorrow 8/17 and call her back.

## 2020-12-27 NOTE — Progress Notes (Signed)
Cardiology Office Note   Date:  12/30/2020   ID:  Joseph Bird, Joseph Bird 06-Dec-1945, MRN [id number]  PCP:  Bonnita Nasuti, MD  Cardiologist:   Rito Lecomte Martinique, MD   Chief Complaint  Patient presents with   AAA       History of Present Illness: Joseph Bird is a 75 y.o. male who is seen for evaluation of AAA. He has a history of nonischemic CM. Last seen in May 2019. He has a history of DM with neuropathy and gastroparesis, HLD,  and HTN. He has a history of nonischemic cardiomyopathy and minimal nonobstructive CAD by cardiac cath in June 2017. EF 40%. There was a stress Echo dated 10/02/15. Report is difficult to read. It mentions anteroapical ischemia and diffuse hypokinesis. I do not see EF reported. Cath report as noted above. Holter monitor report from 10/17/15: this showed frequent PVCs with couplets and two 3 beat runs of PVCs. By report there were 855 ventricular ectopic beats/hour or 18.3% of beats.  Echo dated 04/16/16 reported EF 55-60% with moderate LVH. Normal valves. Normal wall motion. Repeat Echo in Sept 2018 showed normal LV function.   Mr Joseph Bird grew up and still lives in [place]. He operates several assisted living centers.   He was previously on Coreg and lisinopril. He was hospitalized in January 2018 for a partial nephrectomy. Apparently in the hospital he was noted to have bradycardia with HR down in the low 40s.  Coreg was discontinued. He reports he was switched from lisinopril to [facility] in May. He is still active. Swims a mile 3 days a week. Notes recently he has slowed down some - a little more fatigued. Sugars have increased - reports last A1c up to 8.9%. Recommended he start Rybelsus but apparently [facility] not [facility].   He recently had an Abd [place] to follow AAA. Reported prior max dimension of 4.3 cm. Recent exam reported increase to 6.2 cm. He then had a CTA done demonstrating maximal diameter of 3.4 cm.     Past Medical History:  Diagnosis Date   Anxiety     Arthritis    Bell's palsy    hx of    Cancer ([place]) 2010   prostate, kidney cancer    CHF (congestive heart failure) (HCC)    hx of    Chronic nausea    Depression    Diabetes mellitus without complication ([place])    type II   GERD (gastroesophageal reflux disease)    History of kidney stones    Hyperlipidemia    Hypertension    Irregular heart beat    Neuropathy due to secondary diabetes (HCC)    Pneumonia    hx of x 2    PONV (postoperative nausea and vomiting)    PVC (premature ventricular contraction)     Past Surgical History:  Procedure Laterality Date   BACK SURGERY     x3   CARDIAC CATHETERIZATION     CHOLECYSTECTOMY     EYE SURGERY     right cataract   MAXIMUM ACCESS (MAS)POSTERIOR LUMBAR INTERBODY FUSION (PLIF) 2 LEVEL N/A 03/09/2013   Procedure: FOR MAXIMUM ACCESS (MAS) POSTERIOR LUMBAR INTERBODY FUSION (PLIF) 2 LEVEL;  Surgeon: Eustace Moore, MD;  Location: [facility] NEURO ORS;  Service: Neurosurgery;  Laterality: N/A;  POSTERIOR LUMBAR INTERBODY FUSION (PLIF) 2 LEVEL   PROSTATECTOMY     ROBOTIC ASSITED PARTIAL NEPHRECTOMY Right 05/21/2016   Procedure: XI ROBOTIC ASSITED PARTIAL NEPHRECTOMY;  Surgeon: Raynelle Bring, MD;  Location: [facility];  Service: Urology;  Laterality: Right;  clamp time: 16 minutes   TONSILLECTOMY       Current Outpatient Medications  Medication Sig Dispense Refill   ALPRAZolam (XANAX) 0.25 MG tablet Take 0.125 mg by mouth every 6 (six) hours as needed for anxiety.      calcium-vitamin D (OSCAL WITH D) 500-200 MG-UNIT per tablet Take 1 tablet by mouth daily with breakfast.     Cholecalciferol (VITAMIN D) 2000 UNITS CAPS Take 2,000 Units by mouth daily.     glipiZIDE (GLUCOTROL) 5 MG tablet Take 2.5 mg by mouth 2 (two) times daily before a meal.     latanoprost (XALATAN) 0.005 % ophthalmic solution Place 1 drop into both eyes at bedtime.     metFORMIN (GLUCOPHAGE) 1000 MG tablet Take 1,000 mg by mouth 2 (two) times daily with a meal.     morphine (MS  CONTIN) 15 MG 12 hr tablet Take 15 mg by mouth every 12 (twelve) hours. Verified with [name]      Multiple Vitamin (MULTIVITAMIN WITH MINERALS) TABS tablet Take 1 tablet by mouth daily.     pravastatin (PRAVACHOL) 40 MG tablet Take 20 mg by mouth daily.     sacubitril-valsartan (ENTRESTO) 24-26 MG Take 1 tablet by mouth 2 (two) times daily.     Semaglutide (RYBELSUS) 3 MG TABS Take by mouth.     Travoprost, BAK Free, (TRAVATAN) 0.004 % SOLN ophthalmic solution Place 1 drop into the right eye at bedtime.     No current facility-administered medications for this visit.    Allergies:   Demerol [meperidine]    Social History:  The patient  reports that he has never smoked. He has never used smokeless tobacco. He reports that he does not drink alcohol and does not use drugs.   Family History:  The patient's family history includes Alzheimer's disease in his mother; Heart attack in his brother; Heart disease in his brother; Stroke in his father.    ROS:  Please see the history of present illness.   Otherwise, review of systems are positive for episodes of marked shivering that may last up to 30 minutes.   All other systems are reviewed and negative.    PHYSICAL EXAM: VS:  BP 122/60   Ht '5\' 10"'$  (1.778 m)   Wt 192 lb 3.2 oz (87.2 kg)   SpO2 97%   BMI 27.58 kg/m  , BMI Body mass index is 27.58 kg/m. GENERAL:  Well appearing, thin WM in NAD HEENT:  PERRL, EOMI, sclera are clear. Oropharynx is clear. NECK:  No jugular venous distention, carotid upstroke brisk and symmetric, no bruits, no thyromegaly or adenopathy LUNGS:  Clear to auscultation bilaterally CHEST:  Unremarkable HEART:  RRR,  PMI not displaced or sustained,S1 and S2 within normal limits, no S3, no S4: no clicks, no rubs, no murmurs ABD:  Soft, nontender. BS +, no masses or bruits. No hepatomegaly, no splenomegaly EXT:  2 + pulses throughout, no edema, no cyanosis no clubbing SKIN:  Warm and dry.  No rashes NEURO:   Alert and oriented x 3. Cranial nerves II through XII intact. PSYCH:  Cognitively intact     EKG:  EKG is ordered today. NSR rate 74 with PACs. LAFB, RBBB. Compared to 2018 RBBB is new. I have personally reviewed and interpreted this study.     Recent Labs: No results found for requested labs within last 8760 hours.  Lipid Panel No results found for: CHOL, TRIG, HDL, CHOLHDL, VLDL, LDLCALC, LDLDIRECT    Wt Readings from Last 3 Encounters:  12/30/20 192 lb 3.2 oz (87.2 kg)  09/16/17 195 lb 12.8 oz (88.8 kg)  01/07/17 198 lb 9.6 oz (90.1 kg)    Dated 05/20/16: A1c 7.6%. Hgb 13.9.  Dated dated 06/29/17: normal CMET  Dated 08/30/20: normal CMET  Other studies Reviewed: Additional studies/ records that were reviewed today include: see HPI.  Echo: 01/19/17:Study Conclusions   - Left ventricle: The cavity size was normal. Wall thickness was   normal. Systolic function was normal. The estimated ejection   fraction was in the range of 55% to 60%. Wall motion was normal;   there were no regional wall motion abnormalities. Doppler   parameters are consistent with abnormal left ventricular   relaxation (grade 1 diastolic dysfunction). - Ascending aorta: The ascending aorta was mildly dilated. - Mitral valve: Calcified annulus.   Impressions:   - Normal LV systolic function; mild diastolic dysfunction; mildly   dilated ascending aorta.   ASSESSMENT AND PLAN:  1.  History of Nonischemic cardiomyopathy. This was most likely due to his DM and HTN. EF normalized by last Echo in 2018 . Now with some fatigue. Will update Echo now. If LV function is still normal then there is no compelling reason to switch ACEi to [facility]. If EF is low then this was appropriate and may also need to consider an SGLT2 inhibitor. Hx of bradycardia on beta blocker.  2. DM type 2 with Neuropathy, gastroparesis. Managed per primary care 3. HTN. Controlled. 4. Hyperlipidemia on statin.  5. AAA. Only 3.4 cm  by CT. [place] is erroneous. Will plan to follow up with [place] in our lab in one year.   Current medicines are reviewed at length with the patient today.  The patient does not have concerns regarding medicines.  The following changes have been made:  no change  Labs/ tests ordered today include: none  Orders Placed This Encounter  Procedures   EKG 12-Lead   ECHOCARDIOGRAM COMPLETE      Disposition:   FU with me one year.   Signed, Yoshika Vensel Martinique, MD  12/30/2020 12:00 PM    [facility] [address], [place], [place], [address] Phone [phone], Fax [phone]

## 2020-12-30 ENCOUNTER — Other Ambulatory Visit: Payer: Self-pay

## 2020-12-30 ENCOUNTER — Ambulatory Visit (INDEPENDENT_AMBULATORY_CARE_PROVIDER_SITE_OTHER): Payer: Medicare Other | Admitting: Cardiology

## 2020-12-30 ENCOUNTER — Encounter: Payer: Self-pay | Admitting: Cardiology

## 2020-12-30 VITALS — BP 122/60 | Ht 70.0 in | Wt 192.2 lb

## 2020-12-30 DIAGNOSIS — E119 Type 2 diabetes mellitus without complications: Secondary | ICD-10-CM | POA: Diagnosis not present

## 2020-12-30 DIAGNOSIS — I714 Abdominal aortic aneurysm, without rupture, unspecified: Secondary | ICD-10-CM

## 2020-12-30 DIAGNOSIS — I4510 Unspecified right bundle-branch block: Secondary | ICD-10-CM

## 2020-12-30 DIAGNOSIS — I428 Other cardiomyopathies: Secondary | ICD-10-CM

## 2020-12-30 DIAGNOSIS — R5383 Other fatigue: Secondary | ICD-10-CM | POA: Diagnosis not present

## 2020-12-30 MED ORDER — ASPIRIN EC 81 MG PO TBEC
81.0000 mg | DELAYED_RELEASE_TABLET | Freq: Every day | ORAL | 3 refills | Status: AC
Start: 2020-12-30 — End: ?

## 2020-12-30 NOTE — Patient Instructions (Signed)
Medication Instructions:   Continue same medications   Lab Work:  None ordered   Testing/Procedures:  Schedule Echo   Follow-Up: At [facility], you and your health needs are our priority.  As part of our continuing mission to provide you with exceptional heart care, we have created designated Provider Care Teams.  These Care Teams include your primary Cardiologist (physician) and Advanced Practice Providers (APPs -  Physician Assistants and Nurse Practitioners) who all work together to provide you with the care you need, when you need it.  We recommend signing up for the patient portal called "MyChart".  Sign up information is provided on this After Visit Summary.  MyChart is used to connect with patients for Virtual Visits (Telemedicine).  Patients are able to view lab/test results, encounter notes, upcoming appointments, etc.  Non-urgent messages can be sent to your provider as well.   To learn more about what you can do with MyChart, go to [email or web address].      Your next appointment:  To be determined   The format for your next appointment: Office   Provider:  Dr.Jordan

## 2021-01-15 ENCOUNTER — Other Ambulatory Visit: Payer: Self-pay

## 2021-01-15 ENCOUNTER — Ambulatory Visit (HOSPITAL_COMMUNITY): Payer: Medicare Other | Attending: Cardiology

## 2021-01-15 DIAGNOSIS — I714 Abdominal aortic aneurysm, without rupture, unspecified: Secondary | ICD-10-CM

## 2021-01-15 DIAGNOSIS — I428 Other cardiomyopathies: Secondary | ICD-10-CM | POA: Diagnosis present

## 2021-01-15 DIAGNOSIS — I4510 Unspecified right bundle-branch block: Secondary | ICD-10-CM | POA: Diagnosis present

## 2021-01-15 DIAGNOSIS — E119 Type 2 diabetes mellitus without complications: Secondary | ICD-10-CM | POA: Diagnosis present

## 2021-01-15 DIAGNOSIS — R5383 Other fatigue: Secondary | ICD-10-CM | POA: Insufficient documentation

## 2021-01-15 LAB — ECHOCARDIOGRAM COMPLETE
Area-P 1/2: 3.15 cm2
S' Lateral: 3.4 cm

## 2021-01-16 ENCOUNTER — Other Ambulatory Visit (HOSPITAL_COMMUNITY): Payer: Medicare Other

## 2021-02-27 ENCOUNTER — Ambulatory Visit: Payer: Medicare Other | Admitting: Cardiology

## 2022-01-14 ENCOUNTER — Telehealth: Payer: Self-pay | Admitting: Cardiology

## 2022-01-14 NOTE — Telephone Encounter (Signed)
Spoke to [place] with Dr.Hague's office calling to report patient had a echo done 8/1 in their office revealed dilated aortic root 3.9 cm.She faxed report to our office.Advised I will show Dr.Jordan when he is back in office.

## 2022-01-14 NOTE — Telephone Encounter (Signed)
Pt's PCP is calling requesting a call back to confirm results they sent over via fax in regards to patients hernia. They state that it has gotten bigger and would like to discuss appts.

## 2022-01-16 ENCOUNTER — Telehealth: Payer: Self-pay | Admitting: Cardiology

## 2022-01-16 ENCOUNTER — Telehealth: Payer: Self-pay

## 2022-01-16 DIAGNOSIS — I714 Abdominal aortic aneurysm, without rupture, unspecified: Secondary | ICD-10-CM

## 2022-01-16 DIAGNOSIS — I4510 Unspecified right bundle-branch block: Secondary | ICD-10-CM

## 2022-01-16 DIAGNOSIS — I428 Other cardiomyopathies: Secondary | ICD-10-CM

## 2022-01-16 NOTE — Telephone Encounter (Signed)
Returning nurses call regarding recommendation. Please advise

## 2022-01-16 NOTE — Telephone Encounter (Signed)
Called patient left message on personal voice mail Dr.Jordan reviewed echo that was done at your PCP.Advised to call me back for Dr.Jordan's recommendation.

## 2022-01-16 NOTE — Telephone Encounter (Signed)
Spoke to patient's wife Dr.Jordan reviewed echo done at [facility] office.He advised repeat echo here at our office.Scheduler will call back next week with appointment.

## 2022-01-27 ENCOUNTER — Ambulatory Visit (HOSPITAL_COMMUNITY): Payer: Medicare Other | Attending: Cardiology

## 2022-01-27 DIAGNOSIS — I428 Other cardiomyopathies: Secondary | ICD-10-CM | POA: Insufficient documentation

## 2022-01-27 DIAGNOSIS — I77810 Thoracic aortic ectasia: Secondary | ICD-10-CM | POA: Diagnosis not present

## 2022-01-27 DIAGNOSIS — I4510 Unspecified right bundle-branch block: Secondary | ICD-10-CM | POA: Insufficient documentation

## 2022-01-27 DIAGNOSIS — I714 Abdominal aortic aneurysm, without rupture, unspecified: Secondary | ICD-10-CM | POA: Insufficient documentation

## 2022-01-27 LAB — ECHOCARDIOGRAM COMPLETE
Area-P 1/2: 3.56 cm2
S' Lateral: 3.7 cm

## 2022-01-29 ENCOUNTER — Other Ambulatory Visit: Payer: Self-pay

## 2022-01-29 DIAGNOSIS — I428 Other cardiomyopathies: Secondary | ICD-10-CM

## 2022-01-29 DIAGNOSIS — I714 Abdominal aortic aneurysm, without rupture, unspecified: Secondary | ICD-10-CM

## 2022-01-29 DIAGNOSIS — Z01812 Encounter for preprocedural laboratory examination: Secondary | ICD-10-CM

## 2022-01-31 LAB — COMPREHENSIVE METABOLIC PANEL
ALT: 21 IU/L (ref 0–44)
AST: 20 IU/L (ref 0–40)
Albumin/Globulin Ratio: 2.1 (ref 1.2–2.2)
Albumin: 4.6 g/dL (ref 3.8–4.8)
Alkaline Phosphatase: 73 IU/L (ref 44–121)
BUN/Creatinine Ratio: 22 (ref 10–24)
BUN: 23 mg/dL (ref 8–27)
Bilirubin Total: 0.7 mg/dL (ref 0.0–1.2)
CO2: 26 mmol/L (ref 20–29)
Calcium: 10.1 mg/dL (ref 8.6–10.2)
Chloride: 100 mmol/L (ref 96–106)
Creatinine, Ser: 1.03 mg/dL (ref 0.76–1.27)
Globulin, Total: 2.2 g/dL (ref 1.5–4.5)
Glucose: 94 mg/dL (ref 70–99)
Potassium: 5 mmol/L (ref 3.5–5.2)
Sodium: 139 mmol/L (ref 134–144)
Total Protein: 6.8 g/dL (ref 6.0–8.5)
eGFR: 76 mL/min/{1.73_m2} (ref >59)

## 2022-01-31 LAB — CBC WITH DIFFERENTIAL/PLATELET
Basophils Absolute: 0.1 10*3/uL (ref 0.0–0.2)
Basos: 1 %
EOS (ABSOLUTE): 0.6 10*3/uL — ABNORMAL HIGH (ref 0.0–0.4)
Eos: 6 %
Hematocrit: 47.2 % (ref 37.5–51.0)
Hemoglobin: 16.3 g/dL (ref 13.0–17.7)
Immature Grans (Abs): 0 10*3/uL (ref 0.0–0.1)
Immature Granulocytes: 0 %
Lymphocytes Absolute: 1.7 10*3/uL (ref 0.7–3.1)
Lymphs: 19 %
MCH: 33 pg (ref 26.6–33.0)
MCHC: 34.5 g/dL (ref 31.5–35.7)
MCV: 96 fL (ref 79–97)
Monocytes Absolute: 0.7 10*3/uL (ref 0.1–0.9)
Monocytes: 7 %
Neutrophils Absolute: 6.2 10*3/uL (ref 1.4–7.0)
Neutrophils: 67 %
Platelets: 204 10*3/uL (ref 150–450)
RBC: 4.94 x10E6/uL (ref 4.14–5.80)
RDW: 12.8 % (ref 11.6–15.4)
WBC: 9.2 10*3/uL (ref 3.4–10.8)

## 2022-01-31 LAB — LIPID PANEL
Chol/HDL Ratio: 3.5 ratio (ref 0.0–5.0)
Cholesterol, Total: 183 mg/dL (ref 100–199)
HDL: 52 mg/dL (ref >39)
LDL Chol Calc (NIH): 113 mg/dL — ABNORMAL HIGH (ref 0–99)
Triglycerides: 98 mg/dL (ref 0–149)
VLDL Cholesterol Cal: 18 mg/dL (ref 5–40)

## 2022-02-03 ENCOUNTER — Other Ambulatory Visit: Payer: Self-pay

## 2022-02-03 ENCOUNTER — Telehealth: Payer: Self-pay | Admitting: Cardiology

## 2022-02-03 DIAGNOSIS — I7789 Other specified disorders of arteries and arterioles: Secondary | ICD-10-CM

## 2022-02-03 NOTE — Progress Notes (Signed)
Chest ct

## 2022-02-03 NOTE — Telephone Encounter (Signed)
Spoke to patient's wife echo results given.Chest Ct angio scheduled 10/26 at 2:40 pm at [facility].Instructions mailed.Advised to keep appointment with Dr.Jordan 11/6 at 10:00 am.

## 2022-02-03 NOTE — Telephone Encounter (Signed)
Wife calling in with questions regarding patient CT. Please advise

## 2022-02-23 NOTE — Progress Notes (Signed)
8    Cardiology Office Note   Date:  03/09/2022   ID:  Joseph Bird, DOB 1946-02-03, MRN [id number]  PCP:  Bonnita Nasuti, MD  Cardiologist:   Duncan Alejandro Martinique, MD   Chief Complaint  Patient presents with   Congestive Heart Failure   AAA       History of Present Illness: Joseph Bird is a 76 y.o. male who is seen for evaluation of AAA. He has a history of nonischemic CM. Last seen in May 2019. He has a history of DM with neuropathy and gastroparesis, HLD,  and HTN. He has a history of nonischemic cardiomyopathy and minimal nonobstructive CAD by cardiac cath in June 2017. EF 40%. There was a stress Echo dated 10/02/15. Report is difficult to read. It mentions anteroapical ischemia and diffuse hypokinesis. I do not see EF reported. Cath report as noted above. Holter monitor report from 10/17/15: this showed frequent PVCs with couplets and two 3 beat runs of PVCs. By report there were 855 ventricular ectopic beats/hour or 18.3% of beats.  Echo dated 04/16/16 reported EF 55-60% with moderate LVH. Normal valves. Normal wall motion. Repeat Echo in Sept 2018 showed normal LV function.   Mr Daughety grew up and still lives in [place]. He operates several assisted living centers.   He was previously on Coreg and lisinopril. He was hospitalized in January 2018 for a partial nephrectomy. Apparently in the hospital he was noted to have bradycardia with HR down in the low 40s.  Coreg was discontinued. He reports he was switched from lisinopril to [facility] in May. He is still active. Swims a mile 3 days a week. Notes recently he has slowed down some - a little more fatigued. Sugars have increased - reports last A1c up to 8.9%. Was initially started on [place] but more recently switched to [place].   In past he had an Abd [place] to follow AAA. Reported prior max dimension of 4.3 cm. US exam reported increase to 6.2 cm. He then had a CTA done demonstrating maximal diameter of 3.4 cm. This was in August 2022.    Recent Echo in September showed reduced  EF to  40-45%. Aorta measured 4.4 cm. CT recommended and showed no enlargement of the thoracic aorta. CT also demonstrated a large amount of coronary calcification.   Past Medical History:  Diagnosis Date   Anxiety    Arthritis    Bell's palsy    hx of    Cancer ([place]) 2010   prostate, kidney cancer    CHF (congestive heart failure) (HCC)    hx of    Chronic nausea    Depression    Diabetes mellitus without complication ([place])    type II   GERD (gastroesophageal reflux disease)    History of kidney stones    Hyperlipidemia    Hypertension    Irregular heart beat    Neuropathy due to secondary diabetes ([place])    Pneumonia    hx of x 2    PONV (postoperative nausea and vomiting)    PVC (premature ventricular contraction)     Past Surgical History:  Procedure Laterality Date   BACK SURGERY     x3   CARDIAC CATHETERIZATION     CHOLECYSTECTOMY     EYE SURGERY     right cataract   MAXIMUM ACCESS (MAS)POSTERIOR LUMBAR INTERBODY FUSION (PLIF) 2 LEVEL N/A 03/09/2013   Procedure: FOR MAXIMUM ACCESS (MAS) POSTERIOR LUMBAR INTERBODY FUSION (PLIF) 2  LEVEL;  Surgeon: Eustace Moore, MD;  Location: [place] [facility];  Service: Neurosurgery;  Laterality: N/A;  POSTERIOR LUMBAR INTERBODY FUSION (PLIF) 2 LEVEL   PROSTATECTOMY     ROBOTIC ASSITED PARTIAL NEPHRECTOMY Right 05/21/2016   Procedure: XI ROBOTIC ASSITED PARTIAL NEPHRECTOMY;  Surgeon: Raynelle Bring, MD;  Location: [facility];  Service: Urology;  Laterality: Right;  clamp time: 16 minutes   TONSILLECTOMY       Current Outpatient Medications  Medication Sig Dispense Refill   ALPRAZolam (XANAX) 0.25 MG tablet Take 0.125 mg by mouth every 6 (six) hours as needed for anxiety.      aspirin EC 81 MG tablet Take 1 tablet (81 mg total) by mouth daily. Swallow whole. 90 tablet 3   calcium-vitamin D (OSCAL WITH D) 500-200 MG-UNIT per tablet Take 1 tablet by mouth daily with breakfast.     carvedilol (COREG)  6.25 MG tablet Take 1 tablet (6.25 mg total) by mouth 2 (two) times daily. 180 tablet 3   Cholecalciferol (VITAMIN D) 2000 UNITS CAPS Take 2,000 Units by mouth daily.     empagliflozin (JARDIANCE) 25 MG TABS tablet Take 1 tablet (25 mg total) by mouth daily before breakfast. 30 tablet    glipiZIDE (GLUCOTROL) 5 MG tablet Take 2.5 mg by mouth 2 (two) times daily before a meal.     latanoprost (XALATAN) 0.005 % ophthalmic solution Place 1 drop into both eyes at bedtime.     metFORMIN (GLUCOPHAGE) 1000 MG tablet Take 1,000 mg by mouth 2 (two) times daily with a meal.     morphine (MS CONTIN) 15 MG 12 hr tablet Take 15 mg by mouth every 12 (twelve) hours. Verified with [place]      Multiple Vitamin (MULTIVITAMIN WITH MINERALS) TABS tablet Take 1 tablet by mouth daily.     rosuvastatin (CRESTOR) 20 MG tablet Take 1 tablet (20 mg total) by mouth daily. 90 tablet 3   sacubitril-valsartan (ENTRESTO) 24-26 MG Take 1 tablet by mouth 2 (two) times daily.     Travoprost, BAK Free, (TRAVATAN) 0.004 % SOLN ophthalmic solution Place 1 drop into the right eye at bedtime.     No current facility-administered medications for this visit.    Allergies:   Demerol [meperidine], Ezetimibe, Hydrocodone, Meperidine hcl, Niacin, Promethazine hcl, Rosuvastatin, Simvastatin, and Sulfamethoxazole-trimethoprim    Social History:  The patient  reports that he has never smoked. He has never used smokeless tobacco. He reports that he does not drink alcohol and does not use drugs.   Family History:  The patient's family history includes Alzheimer's disease in his mother; Heart attack in his brother; Heart disease in his brother; Stroke in his father.    ROS:  Please see the history of present illness.   Otherwise, review of systems are positive for episodes of marked shivering that may last up to 30 minutes.   All other systems are reviewed and negative.    PHYSICAL EXAM: VS:  BP 118/62 (BP Location: Left Arm,  Patient Position: Sitting, Cuff Size: Normal)   Pulse 73   Ht '5\' 10"'$  (1.778 m)   Wt 188 lb 12.8 oz (85.6 kg)   SpO2 99%   BMI 27.09 kg/m  , BMI Body mass index is 27.09 kg/m. GENERAL:  Well appearing, thin WM in NAD HEENT:  PERRL, EOMI, sclera are clear. Oropharynx is clear. NECK:  No jugular venous distention, carotid upstroke brisk and symmetric, no bruits, no thyromegaly or adenopathy LUNGS:  Clear to  auscultation bilaterally CHEST:  Unremarkable HEART:  RRR,  PMI not displaced or sustained,S1 and S2 within normal limits, no S3, no S4: no clicks, no rubs, no murmurs ABD:  Soft, nontender. BS +, no masses or bruits. No hepatomegaly, no splenomegaly EXT:  2 + pulses throughout, no edema, no cyanosis no clubbing SKIN:  Warm and dry.  No rashes NEURO:  Alert and oriented x 3. Cranial nerves II through XII intact. PSYCH:  Cognitively intact     EKG:  EKG is ordered today. NSR rate 73 with PVCs. LAFB, LVH with QRS widening.  I have personally reviewed and interpreted this study.     Recent Labs: 01/30/2022: ALT 21; BUN 23; Creatinine, Ser 1.03; Hemoglobin 16.3; Platelets 204; Potassium 5.0; Sodium 139    Lipid Panel    Component Value Date/Time   CHOL 183 01/30/2022 1036   TRIG 98 01/30/2022 1036   HDL 52 01/30/2022 1036   CHOLHDL 3.5 01/30/2022 1036   LDLCALC 113 (H) 01/30/2022 1036      Wt Readings from Last 3 Encounters:  03/09/22 188 lb 12.8 oz (85.6 kg)  12/30/20 192 lb 3.2 oz (87.2 kg)  09/16/17 195 lb 12.8 oz (88.8 kg)    Dated 05/20/16: A1c 7.6%. Hgb 13.9.  Dated dated 06/29/17: normal CMET  Dated 08/30/20: normal CMET  Other studies Reviewed: Additional studies/ records that were reviewed today include: see HPI.  Echo: 01/19/17:Study Conclusions   - Left ventricle: The cavity size was normal. Wall thickness was   normal. Systolic function was normal. The estimated ejection   fraction was in the range of 55% to 60%. Wall motion was normal;   there were no  regional wall motion abnormalities. Doppler   parameters are consistent with abnormal left ventricular   relaxation (grade 1 diastolic dysfunction). - Ascending aorta: The ascending aorta was mildly dilated. - Mitral valve: Calcified annulus.   Impressions:   - Normal LV systolic function; mild diastolic dysfunction; mildly   dilated ascending aorta.  Echo 01/27/22: IMPRESSIONS     1. Left ventricular ejection fraction, by estimation, is 40 to 45%. The  left ventricle has mildly decreased function. The left ventricle  demonstrates global hypokinesis. The left ventricular internal cavity size  was mildly dilated. Left ventricular  diastolic parameters are consistent with Grade I diastolic dysfunction  (impaired relaxation).   2. Right ventricular systolic function is normal. The right ventricular  size is normal.   3. The mitral valve is normal in structure. Trivial mitral valve  regurgitation. No evidence of mitral stenosis.   4. The aortic valve is tricuspid. Aortic valve regurgitation is trivial.  No aortic stenosis is present.   5. Aortic dilatation noted. There is mild dilatation of the ascending  aorta, measuring 44 mm.   6. The inferior vena cava is normal in size with greater than 50%  respiratory variability, suggesting right atrial pressure of 3 mmHg.   Comparison(s): Prior ascending aorta 41 mm on 01/15/21.   Narrative & Impression  CLINICAL DATA:  History of aortic aneurysm.   EXAM: CT ANGIOGRAPHY CHEST WITH CONTRAST   TECHNIQUE: Multidetector CT imaging of the chest was performed using the standard protocol during bolus administration of intravenous contrast. Multiplanar CT image reconstructions and MIPs were obtained to evaluate the vascular anatomy.   RADIATION DOSE REDUCTION: This exam was performed according to the departmental dose-optimization program which includes automated exposure control, adjustment of the mA and/or kV according to patient size  and/or use of  iterative reconstruction technique.   CONTRAST:  48m ISOVUE-370 IOPAMIDOL (ISOVUE-370) INJECTION 76%   COMPARISON:  CT abdomen dated 12/19/2020.   FINDINGS: Cardiovascular: Preferential opacification of the thoracic aorta. No evidence of thoracic aortic aneurysm or dissection. Vascular calcifications are seen in the coronary arteries and aortic arch. Normal heart size. No pericardial effusion.   Mediastinum/Nodes: No enlarged mediastinal, hilar, or axillary lymph nodes. Thyroid gland, trachea, and esophagus demonstrate no significant findings.   Lungs/Pleura: Lungs are clear. No pleural effusion or pneumothorax.   Upper Abdomen: Nonobstructive renal calculi measure up to 5 mm on the right and 6 mm on the left. No acute process in the visible abdomen.   Musculoskeletal: Degenerative changes are seen in the spine.   Review of the MIP images confirms the above findings.   IMPRESSION: 1. No thoracic aortic aneurysm or dissection. The patient's known abdominal aortic aneurysm is not imaged on this exam.   Aortic Atherosclerosis (ICD10-I70.0).     Electronically Signed   By: TZerita BoersM.D.   On: 02/26/2022 15:19     ASSESSMENT AND PLAN:  1.  History of Nonischemic cardiomyopathy. Initially diagnosed in 2017 with EF 40%. This was most likely due to his DM and HTN. EF normalized by subsequent Echo in 2018 and again in Sept 2022. Now with some fatigue.  Repeat Echo showed EF 40-45% with global HK. Now on Entresto 24/26 mg bid and Jardiance. Will add Coreg 6.25 mg bid. Monitor HR. Needs ischemic work up - will arrange for PET CT cardiac stress.  2. DM type 2 with Neuropathy, gastroparesis. Managed per primary care 3. HTN. Controlled. 4. Hyperlipidemia on statin. LDL 113. Goal is < 70. Tolerating pravastatin but not at goal. Will switch to Crestor 20 mg daily. Prior intolerance to lipitor. If unable to tolerate Crestor will go back to pravastatin and consider add on  therapy.  5. AAA.  3.4 cm by CT. [place] erroneous. Will plan to follow up CT angio now. No evidence of thoracic aneurysm 6. Coronary artery calcification. Nonobstructive disease by cath in 2017 in [place]. Given reduced EF will arrange PET CT stress. Not a good candidate for coronary CTA given high calcium score.    Current medicines are reviewed at length with the patient today.  The patient does not have concerns regarding medicines.  The following changes have been made:  no change  Labs/ tests ordered today include: none  Orders Placed This Encounter  Procedures   Cardiac Stress Test: Informed Consent Details: Physician/Practitioner Attestation; Transcribe to consent form and obtain patient signature   EKG 12-Lead      Disposition:   FU with me after stress test   Signed, Lanisa Ishler JMartinique MD  03/09/2022 10:34 AM    [facility] [address] [place] [id number] [phone] Fax [phone]

## 2022-02-26 ENCOUNTER — Ambulatory Visit
Admission: RE | Admit: 2022-02-26 | Discharge: 2022-02-26 | Disposition: A | Discharge status: Home / Self Care | Payer: Medicare Other | Source: Ambulatory Visit | Attending: Cardiology | Admitting: Cardiology

## 2022-02-26 DIAGNOSIS — I7789 Other specified disorders of arteries and arterioles: Secondary | ICD-10-CM

## 2022-02-26 MED ORDER — IOPAMIDOL (ISOVUE-370) INJECTION 76%
75.0000 mL | Freq: Once | INTRAVENOUS | Status: AC | PRN
  Administered 2022-02-26: 75 mL via INTRAVENOUS

## 2022-03-09 ENCOUNTER — Encounter: Payer: Self-pay | Admitting: Cardiology

## 2022-03-09 ENCOUNTER — Telehealth: Payer: Self-pay

## 2022-03-09 ENCOUNTER — Other Ambulatory Visit: Payer: Self-pay

## 2022-03-09 ENCOUNTER — Ambulatory Visit: Payer: Medicare Other | Attending: Cardiology | Admitting: Cardiology

## 2022-03-09 VITALS — BP 118/62 | HR 73 | Ht 70.0 in | Wt 188.8 lb

## 2022-03-09 DIAGNOSIS — I2510 Atherosclerotic heart disease of native coronary artery without angina pectoris: Secondary | ICD-10-CM | POA: Diagnosis present

## 2022-03-09 DIAGNOSIS — E7800 Pure hypercholesterolemia, unspecified: Secondary | ICD-10-CM | POA: Diagnosis present

## 2022-03-09 DIAGNOSIS — I428 Other cardiomyopathies: Secondary | ICD-10-CM | POA: Diagnosis present

## 2022-03-09 DIAGNOSIS — R072 Precordial pain: Secondary | ICD-10-CM | POA: Insufficient documentation

## 2022-03-09 DIAGNOSIS — I5022 Chronic systolic (congestive) heart failure: Secondary | ICD-10-CM | POA: Insufficient documentation

## 2022-03-09 DIAGNOSIS — I7143 Infrarenal abdominal aortic aneurysm, without rupture: Secondary | ICD-10-CM | POA: Diagnosis present

## 2022-03-09 DIAGNOSIS — I4510 Unspecified right bundle-branch block: Secondary | ICD-10-CM | POA: Diagnosis not present

## 2022-03-09 DIAGNOSIS — E119 Type 2 diabetes mellitus without complications: Secondary | ICD-10-CM | POA: Insufficient documentation

## 2022-03-09 MED ORDER — ROSUVASTATIN CALCIUM 20 MG PO TABS: 20.0000 mg | ORAL_TABLET | Freq: Every day | ORAL | 0 refills | Status: DC

## 2022-03-09 MED ORDER — CARVEDILOL 6.25 MG PO TABS: 6.2500 mg | ORAL_TABLET | Freq: Two times a day (BID) | ORAL | 3 refills | Status: DC

## 2022-03-09 MED ORDER — CARVEDILOL 6.25 MG PO TABS: 6.2500 mg | ORAL_TABLET | Freq: Two times a day (BID) | ORAL | 0 refills | Status: DC

## 2022-03-09 MED ORDER — ROSUVASTATIN CALCIUM 20 MG PO TABS: 20.0000 mg | ORAL_TABLET | Freq: Every day | ORAL | 3 refills | Status: DC

## 2022-03-09 MED ORDER — EMPAGLIFLOZIN 25 MG PO TABS: 25.0000 mg | ORAL_TABLET | Freq: Every day | ORAL | Status: AC

## 2022-03-09 NOTE — Patient Instructions (Signed)
Medication Instructions:  Increase Entresto to 24/26 mg one tablet twice a day Start Carvedilol 6.25 mg twice a day Stop Pravastatin Start Rosuvastatin 20 mg daily Continue all other medications *If you need a refill on your cardiac medications before your next appointment, please call your pharmacy*   Lab Work: Bmet today   Testing/Procedures: Schedule Abdominal CT  Schedule Cardiac Pet Scan  Follow instructions below   Follow-Up: At [facility], you and your health needs are our priority.  As part of our continuing mission to provide you with exceptional heart care, we have created designated Provider Care Teams.  These Care Teams include your primary Cardiologist (physician) and Advanced Practice Providers (APPs -  Physician Assistants and Nurse Practitioners) who all work together to provide you with the care you need, when you need it.  We recommend signing up for the patient portal called "MyChart".  Sign up information is provided on this After Visit Summary.  MyChart is used to connect with patients for Virtual Visits (Telemedicine).  Patients are able to view lab/test results, encounter notes, upcoming appointments, etc.  Non-urgent messages can be sent to your provider as well.   To learn more about what you can do with MyChart, go to [email or web address].    Your next appointment:  After Test    The format for your next appointment: Office   Provider:  Dr.Jordan   How to Prepare for Your Cardiac PET/CT Stress Test:  1. Please do not take these medications before your test:   Medications that may interfere with the cardiac pharmacological stress agent (ex. nitrates - including erectile dysfunction medications or beta-blockers) the day of the exam. (Erectile dysfunction medication should be held for at least 72 hrs prior to test) Theophylline containing medications for 12 hours. Dipyridamole 48 hours prior to the test. Your remaining medications may be  taken with water.  2. Nothing to eat or drink, except water, 3 hours prior to arrival time.   NO caffeine/decaffeinated products, or chocolate 12 hours prior to arrival.  3. NO perfume, cologne or lotion  4. Total time is 1 to 2 hours; you may want to bring reading material for the waiting time.  5. Please report to Admitting at the [facility] Main Entrance 60 minutes early for your test.  [place], [place] [id number]  Diabetic Preparation:  Hold oral medications. You may take NPH and Lantus insulin. Do not take Humalog or Humulin R (Regular Insulin) the day of your test. Check blood sugars prior to leaving the house. If able to eat breakfast prior to 3 hour fasting, you may take all medications, including your insulin, Do not worry if you miss your breakfast dose of insulin - start at your next meal.  IF YOU THINK YOU MAY BE PREGNANT, OR ARE NURSING PLEASE INFORM THE TECHNOLOGIST.  In preparation for your appointment, medication and supplies will be purchased.  Appointment availability is limited, so if you need to cancel or reschedule, please call the Radiology Department at [phone]  24 hours in advance to avoid a cancellation fee of $100.00  What to Expect After you Arrive:  Once you arrive and check in for your appointment, you will be taken to a preparation room within the Radiology Department.  A technologist or Nurse will obtain your medical history, verify that you are correctly prepped for the exam, and explain the procedure.  Afterwards,  an IV will be started in your arm and electrodes  will be placed on your skin for EKG monitoring during the stress portion of the exam. Then you will be escorted to the PET/CT scanner.  There, staff will get you positioned on the scanner and obtain a blood pressure and EKG.  During the exam, you will continue to be connected to the EKG and blood pressure machines.  A small, safe amount of a radioactive tracer will  be injected in your IV to obtain a series of pictures of your heart along with an injection of a stress agent.    After your Exam:  It is recommended that you eat a meal and drink a caffeinated beverage to counter act any effects of the stress agent.  Drink plenty of fluids for the remainder of the day and urinate frequently for the first couple of hours after the exam.  Your doctor will inform you of your test results within 7-10 business days.  For questions about your test or how to prepare for your test, please call: Marchia Bond, Cardiac Imaging Nurse Navigator  Gordy Clement, Cardiac Imaging Nurse Navigator Office: [phone]      Important Information About Sugar

## 2022-03-10 LAB — BASIC METABOLIC PANEL
BUN/Creatinine Ratio: 24 (ref 10–24)
BUN: 23 mg/dL (ref 8–27)
CO2: 22 mmol/L (ref 20–29)
Calcium: 10.4 mg/dL — ABNORMAL HIGH (ref 8.6–10.2)
Chloride: 101 mmol/L (ref 96–106)
Creatinine, Ser: 0.95 mg/dL (ref 0.76–1.27)
Glucose: 152 mg/dL — ABNORMAL HIGH (ref 70–99)
Potassium: 4.8 mmol/L (ref 3.5–5.2)
Sodium: 141 mmol/L (ref 134–144)
eGFR: 83 mL/min/{1.73_m2} (ref >59)

## 2022-03-14 ENCOUNTER — Ambulatory Visit (HOSPITAL_BASED_OUTPATIENT_CLINIC_OR_DEPARTMENT_OTHER)
Admission: RE | Admit: 2022-03-14 | Discharge: 2022-03-14 | Disposition: A | Discharge status: Home / Self Care | Payer: Medicare Other | Source: Ambulatory Visit | Attending: Cardiology | Admitting: Cardiology

## 2022-03-14 DIAGNOSIS — I2510 Atherosclerotic heart disease of native coronary artery without angina pectoris: Secondary | ICD-10-CM

## 2022-03-14 DIAGNOSIS — R072 Precordial pain: Secondary | ICD-10-CM

## 2022-03-14 DIAGNOSIS — I7143 Infrarenal abdominal aortic aneurysm, without rupture: Secondary | ICD-10-CM | POA: Diagnosis present

## 2022-03-14 DIAGNOSIS — I5022 Chronic systolic (congestive) heart failure: Secondary | ICD-10-CM

## 2022-03-14 DIAGNOSIS — I428 Other cardiomyopathies: Secondary | ICD-10-CM | POA: Diagnosis not present

## 2022-03-14 DIAGNOSIS — I4510 Unspecified right bundle-branch block: Secondary | ICD-10-CM | POA: Diagnosis present

## 2022-03-14 DIAGNOSIS — E7800 Pure hypercholesterolemia, unspecified: Secondary | ICD-10-CM

## 2022-03-14 DIAGNOSIS — E119 Type 2 diabetes mellitus without complications: Secondary | ICD-10-CM | POA: Diagnosis present

## 2022-03-14 MED ORDER — IOHEXOL 300 MG/ML  SOLN
100.0000 mL | Freq: Once | INTRAMUSCULAR | Status: AC | PRN
  Administered 2022-03-14: 100 mL via INTRAVENOUS

## 2022-03-18 ENCOUNTER — Telehealth: Payer: Self-pay | Admitting: Cardiology

## 2022-03-18 NOTE — Telephone Encounter (Signed)
  Pt's wife said, she missed a call from [place]. She said, Malachy Mood can call her back tomorrow

## 2022-03-25 ENCOUNTER — Telehealth: Payer: Self-pay | Admitting: Cardiology

## 2022-03-25 NOTE — Telephone Encounter (Signed)
LMTCB

## 2022-03-25 NOTE — Telephone Encounter (Signed)
Pt spouse calling to speak to [place], Therapist, sports. Did not want to entail what it was about.

## 2022-03-25 NOTE — Telephone Encounter (Signed)
Spoke to patient's wife she stated Cardiac pet scan scheduled 11/28.Advised to keep appointment with Dr.Jordan 11/30 at 10:30 am.

## 2022-03-27 ENCOUNTER — Telehealth (HOSPITAL_COMMUNITY): Payer: Self-pay | Admitting: Emergency Medicine

## 2022-03-27 NOTE — Telephone Encounter (Signed)
Reaching out to patient to offer assistance regarding upcoming cardiac imaging study; pt verbalizes understanding of appt date/time, parking situation and where to check in, pre-test NPO status and medications ordered, and verified current allergies; name and call back number provided for further questions should they arise Joseph Bond RN Navigator Cardiac Imaging [facility] [phone] office [phone] cell  Arrival 630  No caffeine No food x 3 hr Holding coreg and diabetes meds

## 2022-03-31 ENCOUNTER — Ambulatory Visit (HOSPITAL_COMMUNITY)
Admission: RE | Admit: 2022-03-31 | Discharge: 2022-03-31 | Disposition: A | Discharge status: Home / Self Care | Payer: Medicare Other | Source: Ambulatory Visit | Attending: Cardiology | Admitting: Cardiology

## 2022-03-31 DIAGNOSIS — R072 Precordial pain: Secondary | ICD-10-CM | POA: Insufficient documentation

## 2022-03-31 MED ORDER — RUBIDIUM RB82 GENERATOR (RUBYFILL)
25.0000 | PACK | Freq: Once | INTRAVENOUS | Status: AC
  Administered 2022-03-31: 22 via INTRAVENOUS

## 2022-03-31 MED ORDER — REGADENOSON 0.4 MG/5ML IV SOLN: 0.4000 mg | Freq: Once | INTRAVENOUS | Status: AC

## 2022-03-31 MED ORDER — REGADENOSON 0.4 MG/5ML IV SOLN
INTRAVENOUS | Status: AC
  Administered 2022-03-31: 0.4 mg via INTRAVENOUS
  Filled 2022-03-31: qty 5

## 2022-03-31 NOTE — Progress Notes (Unsigned)
8    Cardiology Office Note   Date:  04/02/2022   ID:  Joseph Bird, Joseph Bird 03/04/1946, MRN [id number]  PCP:  Bonnita Nasuti, MD  Cardiologist:   Sheree Lalla Martinique, MD   Chief Complaint  Patient presents with   Follow-up   Congestive Heart Failure       History of Present Illness: Joseph Bird is a 76 y.o. male who is seen for evaluation of AAA. He has a history of nonischemic CM. Last seen in May 2019. He has a history of DM with neuropathy and gastroparesis, HLD,  and HTN. He has a history of nonischemic cardiomyopathy and minimal nonobstructive CAD by cardiac cath in June 2017. EF 40%. There was a stress Echo dated 10/02/15. Report is difficult to read. It mentions anteroapical ischemia and diffuse hypokinesis. I do not see EF reported. Cath report as noted above. Holter monitor report from 10/17/15: this showed frequent PVCs with couplets and two 3 beat runs of PVCs. By report there were 855 ventricular ectopic beats/hour or 18.3% of beats.  Echo dated 04/16/16 reported EF 55-60% with moderate LVH. Normal valves. Normal wall motion. Repeat Echo in Sept 2018 showed normal LV function.   Joseph Bird grew up and still lives in [place]. He operates several assisted living centers.   He was previously on Coreg and lisinopril. He was hospitalized in January 2018 for a partial nephrectomy. Apparently in the hospital he was noted to have bradycardia with HR down in the low 40s.  Coreg was discontinued. He reports he was switched from lisinopril to [facility] in May. He is still active. Swims a mile 3 days a week. Notes recently he has slowed down some - a little more fatigued. Sugars have increased - reports last A1c up to 8.9%. Was initially started on [place] but more recently switched to [place].   In past he had an Abd [place] to follow AAA. Reported prior max dimension of 4.3 cm. US exam reported increase to 6.2 cm. He then had a CTA done demonstrating maximal diameter of 3.4 cm. This was in  August 2022.   Recent Echo in September showed reduced  EF to  40-45%. Aorta measured 4.4 cm. CT recommended and showed no enlargement of the thoracic aorta. CT also demonstrated a large amount of coronary calcification. We ordered coronary PET CT and he is seen for follow up. This study showed no ischemia or infarction. Normal coronary blood flow reserve. EF 43%.   On follow up today he feels very well. Did develop some arthralgias on Crestor 20 mg so only taking 10 mg.  No dizziness or syncope. Denies SOB or edema. [facility].    Past Medical History:  Diagnosis Date   Anxiety    Arthritis    Bell's palsy    hx of    Cancer ([place]) 2010   prostate, kidney cancer    CHF (congestive heart failure) (HCC)    hx of    Chronic nausea    Depression    Diabetes mellitus without complication (HCC)    type II   GERD (gastroesophageal reflux disease)    History of kidney stones    Hyperlipidemia    Hypertension    Irregular heart beat    Neuropathy due to secondary diabetes (HCC)    Pneumonia    hx of x 2    PONV (postoperative nausea and vomiting)    PVC (premature ventricular contraction)  Past Surgical History:  Procedure Laterality Date   BACK SURGERY     x3   CARDIAC CATHETERIZATION     CHOLECYSTECTOMY     EYE SURGERY     right cataract   MAXIMUM ACCESS (MAS)POSTERIOR LUMBAR INTERBODY FUSION (PLIF) 2 LEVEL N/A 03/09/2013   Procedure: FOR MAXIMUM ACCESS (MAS) POSTERIOR LUMBAR INTERBODY FUSION (PLIF) 2 LEVEL;  Surgeon: Eustace Moore, MD;  Location: [facility];  Service: Neurosurgery;  Laterality: N/A;  POSTERIOR LUMBAR INTERBODY FUSION (PLIF) 2 LEVEL   PROSTATECTOMY     ROBOTIC ASSITED PARTIAL NEPHRECTOMY Right 05/21/2016   Procedure: XI ROBOTIC ASSITED PARTIAL NEPHRECTOMY;  Surgeon: Raynelle Bring, MD;  Location: [facility];  Service: Urology;  Laterality: Right;  clamp time: 16 minutes   TONSILLECTOMY       Current Outpatient Medications  Medication Sig Dispense  Refill   ALPRAZolam (XANAX) 0.25 MG tablet Take 0.125 mg by mouth every 6 (six) hours as needed for anxiety.      aspirin EC 81 MG tablet Take 1 tablet (81 mg total) by mouth daily. Swallow whole. 90 tablet 3   calcium-vitamin D (OSCAL WITH D) 500-200 MG-UNIT per tablet Take 1 tablet by mouth daily with breakfast.     carvedilol (COREG) 6.25 MG tablet Take 1 tablet (6.25 mg total) by mouth 2 (two) times daily. 60 tablet 0   Cholecalciferol (VITAMIN D) 2000 UNITS CAPS Take 2,000 Units by mouth daily.     empagliflozin (JARDIANCE) 25 MG TABS tablet Take 1 tablet (25 mg total) by mouth daily before breakfast. 30 tablet    glipiZIDE (GLUCOTROL) 5 MG tablet Take 2.5 mg by mouth 2 (two) times daily before a meal.     latanoprost (XALATAN) 0.005 % ophthalmic solution Place 1 drop into both eyes at bedtime.     metFORMIN (GLUCOPHAGE) 1000 MG tablet Take 1,000 mg by mouth 2 (two) times daily with a meal.     morphine (MS CONTIN) 15 MG 12 hr tablet Take 15 mg by mouth every 12 (twelve) hours. Verified with [place]      Multiple Vitamin (MULTIVITAMIN WITH MINERALS) TABS tablet Take 1 tablet by mouth daily.     rosuvastatin (CRESTOR) 20 MG tablet Take 1 tablet (20 mg total) by mouth daily. 30 tablet 0   sacubitril-valsartan (ENTRESTO) 49-51 MG Take 1 tablet by mouth 2 (two) times daily. 60 tablet 6   spironolactone (ALDACTONE) 25 MG tablet Take 0.5 tablets (12.5 mg total) by mouth daily. 45 tablet 3   Travoprost, BAK Free, (TRAVATAN) 0.004 % SOLN ophthalmic solution Place 1 drop into the right eye at bedtime.     No current facility-administered medications for this visit.    Allergies:   Demerol [meperidine], Ezetimibe, Hydrocodone, Meperidine hcl, Niacin, Promethazine hcl, Rosuvastatin, Simvastatin, and Sulfamethoxazole-trimethoprim    Social History:  The patient  reports that he has never smoked. He has never used smokeless tobacco. He reports that he does not drink alcohol and does not use  drugs.   Family History:  The patient's family history includes Alzheimer's disease in his mother; Heart attack in his brother; Heart disease in his brother; Stroke in his father.    ROS:  Please see the history of present illness.   Otherwise, review of systems are positive for episodes of marked shivering that may last up to 30 minutes.   All other systems are reviewed and negative.    PHYSICAL EXAM: VS:  BP 128/70 (BP Location: Right Arm,  Patient Position: Sitting, Cuff Size: Normal)   Pulse 70   Ht '5\' 11"'$  (1.803 m)   Wt 190 lb (86.2 kg)   BMI 26.50 kg/m  , BMI Body mass index is 26.5 kg/m. GENERAL:  Well appearing, thin WM in NAD HEENT:  PERRL, EOMI, sclera are clear. Oropharynx is clear. NECK:  No jugular venous distention, carotid upstroke brisk and symmetric, no bruits, no thyromegaly or adenopathy LUNGS:  Clear to auscultation bilaterally CHEST:  Unremarkable HEART:  RRR,  PMI not displaced or sustained,S1 and S2 within normal limits, no S3, no S4: no clicks, no rubs, no murmurs ABD:  Soft, nontender. BS +, no masses or bruits. No hepatomegaly, no splenomegaly EXT:  2 + pulses throughout, no edema, no cyanosis no clubbing SKIN:  Warm and dry.  No rashes NEURO:  Alert and oriented x 3. Cranial nerves II through XII intact. PSYCH:  Cognitively intact     EKG:  EKG is not ordered today.     Recent Labs: 01/30/2022: ALT 21; Hemoglobin 16.3; Platelets 204 03/09/2022: BUN 23; Creatinine, Ser 0.95; Potassium 4.8; Sodium 141    Lipid Panel    Component Value Date/Time   CHOL 183 01/30/2022 1036   TRIG 98 01/30/2022 1036   HDL 52 01/30/2022 1036   CHOLHDL 3.5 01/30/2022 1036   LDLCALC 113 (H) 01/30/2022 1036      Wt Readings from Last 3 Encounters:  04/02/22 190 lb (86.2 kg)  03/09/22 188 lb 12.8 oz (85.6 kg)  12/30/20 192 lb 3.2 oz (87.2 kg)    Dated 05/20/16: A1c 7.6%. Hgb 13.9.  Dated dated 06/29/17: normal CMET  Dated 08/30/20: normal CMET  Other studies  Reviewed: Additional studies/ records that were reviewed today include: see HPI.  Echo: 01/19/17:Study Conclusions   - Left ventricle: The cavity size was normal. Wall thickness was   normal. Systolic function was normal. The estimated ejection   fraction was in the range of 55% to 60%. Wall motion was normal;   there were no regional wall motion abnormalities. Doppler   parameters are consistent with abnormal left ventricular   relaxation (grade 1 diastolic dysfunction). - Ascending aorta: The ascending aorta was mildly dilated. - Mitral valve: Calcified annulus.   Impressions:   - Normal LV systolic function; mild diastolic dysfunction; mildly   dilated ascending aorta.  Echo 01/27/22: IMPRESSIONS     1. Left ventricular ejection fraction, by estimation, is 40 to 45%. The  left ventricle has mildly decreased function. The left ventricle  demonstrates global hypokinesis. The left ventricular internal cavity size  was mildly dilated. Left ventricular  diastolic parameters are consistent with Grade I diastolic dysfunction  (impaired relaxation).   2. Right ventricular systolic function is normal. The right ventricular  size is normal.   3. The mitral valve is normal in structure. Trivial mitral valve  regurgitation. No evidence of mitral stenosis.   4. The aortic valve is tricuspid. Aortic valve regurgitation is trivial.  No aortic stenosis is present.   5. Aortic dilatation noted. There is mild dilatation of the ascending  aorta, measuring 44 mm.   6. The inferior vena cava is normal in size with greater than 50%  respiratory variability, suggesting right atrial pressure of 3 mmHg.   Comparison(s): Prior ascending aorta 41 mm on 01/15/21.   Narrative & Impression  CLINICAL DATA:  History of aortic aneurysm.   EXAM: CT ANGIOGRAPHY CHEST WITH CONTRAST   TECHNIQUE: Multidetector CT imaging of the chest was  performed using the standard protocol during bolus administration of  intravenous contrast. Multiplanar CT image reconstructions and MIPs were obtained to evaluate the vascular anatomy.   RADIATION DOSE REDUCTION: This exam was performed according to the departmental dose-optimization program which includes automated exposure control, adjustment of the mA and/or kV according to patient size and/or use of iterative reconstruction technique.   CONTRAST:  32m ISOVUE-370 IOPAMIDOL (ISOVUE-370) INJECTION 76%   COMPARISON:  CT abdomen dated 12/19/2020.   FINDINGS: Cardiovascular: Preferential opacification of the thoracic aorta. No evidence of thoracic aortic aneurysm or dissection. Vascular calcifications are seen in the coronary arteries and aortic arch. Normal heart size. No pericardial effusion.   Mediastinum/Nodes: No enlarged mediastinal, hilar, or axillary lymph nodes. Thyroid gland, trachea, and esophagus demonstrate no significant findings.   Lungs/Pleura: Lungs are clear. No pleural effusion or pneumothorax.   Upper Abdomen: Nonobstructive renal calculi measure up to 5 mm on the right and 6 mm on the left. No acute process in the visible abdomen.   Musculoskeletal: Degenerative changes are seen in the spine.   Review of the MIP images confirms the above findings.   IMPRESSION: 1. No thoracic aortic aneurysm or dissection. The patient's known abdominal aortic aneurysm is not imaged on this exam.   Aortic Atherosclerosis (ICD10-I70.0).     Electronically Signed   By: TZerita BoersM.D.   On: 02/26/2022 15:19      PET CT 03/31/22: Study Result  Narrative & Impression      The study is normal. The study is low risk.   LV perfusion is normal. There is no evidence of ischemia. There is no evidence of infarction.   Rest left ventricular function is abnormal. Rest global function is mildly reduced. There were no regional wall motion abnormalities. Rest EF: 43 %. Stress left ventricular function is normal. Stress EF: 54 %. End diastolic  cavity size is mildly enlarged. End systolic cavity size is mildly enlarged.   Myocardial blood flow was computed to be 0.644mg/min at rest and 1.6240m/min at stress. Global myocardial blood flow reserve was 2.45 and was normal.   Coronary calcium was present on the attenuation correction CT images. Severe coronary calcifications were present. Coronary calcifications were present in the left anterior descending artery, left circumflex artery and right coronary artery distribution(s).   Severe coronary calcifications, but normal LV perfusion and flow reserve suggests no significant obstructive CAD.  LVEF is mildly reduced at rest (EF 43%), improves to normal (EF 53%) with stress.  Suspect nonischemic cardiomyopathy. Low risk study   Electonically signed by ChrOswaldo MilianD   CLINICAL DATA:  This over-read does not include interpretation of cardiac or coronary anatomy or pathology. No interpretation the PET data set. The cardiac PET-CT interpretation by the cardiologist is attached.   COMPARISON:  CT chest 02/26/2022   FINDINGS: Limited view of the lung parenchyma demonstrates no suspicious nodularity. Airways are normal.   Limited view of the mediastinum demonstrates no adenopathy. Esophagus normal.   Limited view of the upper abdomen unremarkable.   Limited view of the skeleton and chest wall is unremarkable.   IMPRESSION: No significant extracardiac findings.     Electronically Signed   By: SteSuzy BouchardD.   On: 03/31/2022 08:58        ASSESSMENT AND PLAN:  1.  History of Nonischemic cardiomyopathy. Initially diagnosed in 2017 with EF 40%. This was most likely due to his DM and HTN. EF normalized by subsequent Echo in 2018 and  again in Sept 2022. Now with some fatigue.  Repeat Echo showed EF 40-45% with global HK. PET CT confirms with EF 43%. No ischemia or infarct so this is still nonischemic. Now on Entresto 24/26 mg bid, Jardiance, and Coreg 6.25 mg bid. Will  increase Entresto to 49/51 mg bid. Add aldactone 12.5 mg daily. Follow up BMET next week. Will not change Coreg due to history of bradycardia. Follow up in 3 months 2. DM type 2 with Neuropathy, gastroparesis. Managed per primary care 3. HTN. Controlled. 4. Hyperlipidemia on statin. LDL 113. Goal is < 70. Tolerating pravastatin but not at goal. We switched to Crestor 20  but he developed myalgias/arthralgias. Now on 10 mg daily. Repeat lab next visit.  5. AAA.  3.4 cm by CT. [place] was erroneous. Will plan to follow up CT angio now. No evidence of thoracic aneurysm 6. Coronary artery calcification. Nonobstructive disease by cath in 2017 in [place]. PET CT without ischemia. Normal flow. Continue risk factor modification.    Current medicines are reviewed at length with the patient today.  The patient does not have concerns regarding medicines.  The following changes have been made:  see above.  Labs/ tests ordered today include: none  No orders of the defined types were placed in this encounter.     Disposition:   FU with me 3 months    Signed, Atonya Templer Martinique, MD  04/02/2022 11:08 AM    [facility] [address], [place], [place], [address] Phone [phone], Fax [phone]

## 2022-04-01 LAB — NM PET CT CARDIAC PERFUSION MULTI W/ABSOLUTE BLOODFLOW
LV dias vol: 162 mL (ref 62–150)
LV sys vol: 74 mL
MBFR: 2.45
Nuc Rest EF: 43 %
Nuc Stress EF: 54 %
Rest MBF: 0.66 ml/g/min
Rest Nuclear Isotope Dose: 22 mCi
ST Depression (mm): 0 mm
Stress MBF: 1.62 ml/g/min
Stress Nuclear Isotope Dose: 22 mCi
TID: 0.9

## 2022-04-02 ENCOUNTER — Ambulatory Visit: Payer: Medicare Other | Attending: Cardiology | Admitting: Cardiology

## 2022-04-02 ENCOUNTER — Encounter: Payer: Self-pay | Admitting: Cardiology

## 2022-04-02 VITALS — BP 128/70 | HR 70 | Ht 71.0 in | Wt 190.0 lb

## 2022-04-02 DIAGNOSIS — I2510 Atherosclerotic heart disease of native coronary artery without angina pectoris: Secondary | ICD-10-CM | POA: Diagnosis not present

## 2022-04-02 DIAGNOSIS — I5022 Chronic systolic (congestive) heart failure: Secondary | ICD-10-CM

## 2022-04-02 DIAGNOSIS — I428 Other cardiomyopathies: Secondary | ICD-10-CM | POA: Diagnosis not present

## 2022-04-02 MED ORDER — SACUBITRIL-VALSARTAN 49-51 MG PO TABS: 1.0000 | ORAL_TABLET | Freq: Two times a day (BID) | ORAL | 3 refills | Status: DC

## 2022-04-02 MED ORDER — SPIRONOLACTONE 25 MG PO TABS: 12.5000 mg | ORAL_TABLET | Freq: Every day | ORAL | 3 refills | Status: DC

## 2022-04-02 MED ORDER — SACUBITRIL-VALSARTAN 49-51 MG PO TABS: 1.0000 | ORAL_TABLET | Freq: Two times a day (BID) | ORAL | 6 refills | Status: DC

## 2022-04-02 NOTE — Patient Instructions (Signed)
Medication Instructions:  Increase Entresto to 49/51 mg twice a day Start Aldactone 25 mg take 1/2 tablet daily in morning Continue all other medications *If you need a refill on your cardiac medications before your next appointment, please call your pharmacy*   Lab Work: Bmet next week   Testing/Procedures: None ordered   Follow-Up: At [facility], you and your health needs are our priority.  As part of our continuing mission to provide you with exceptional heart care, we have created designated Provider Care Teams.  These Care Teams include your primary Cardiologist (physician) and Advanced Practice Providers (APPs -  Physician Assistants and Nurse Practitioners) who all work together to provide you with the care you need, when you need it.  We recommend signing up for the patient portal called "MyChart".  Sign up information is provided on this After Visit Summary.  MyChart is used to connect with patients for Virtual Visits (Telemedicine).  Patients are able to view lab/test results, encounter notes, upcoming appointments, etc.  Non-urgent messages can be sent to your provider as well.   To learn more about what you can do with MyChart, go to [email or web address].    Your next appointment:  3 months    The format for your next appointment: Office   Provider:  [place]

## 2022-04-09 LAB — LIPID PANEL
Chol/HDL Ratio: 3.2 ratio (ref 0.0–5.0)
Cholesterol, Total: 142 mg/dL (ref 100–199)
HDL: 45 mg/dL (ref >39)
LDL Chol Calc (NIH): 84 mg/dL (ref 0–99)
Triglycerides: 65 mg/dL (ref 0–149)
VLDL Cholesterol Cal: 13 mg/dL (ref 5–40)

## 2022-04-09 LAB — COMPREHENSIVE METABOLIC PANEL
ALT: 18 IU/L (ref 0–44)
AST: 21 IU/L (ref 0–40)
Albumin/Globulin Ratio: 2.4 — ABNORMAL HIGH (ref 1.2–2.2)
Albumin: 4.7 g/dL (ref 3.8–4.8)
Alkaline Phosphatase: 76 IU/L (ref 44–121)
BUN/Creatinine Ratio: 24 (ref 10–24)
BUN: 23 mg/dL (ref 8–27)
Bilirubin Total: 0.7 mg/dL (ref 0.0–1.2)
CO2: 23 mmol/L (ref 20–29)
Calcium: 10 mg/dL (ref 8.6–10.2)
Chloride: 101 mmol/L (ref 96–106)
Creatinine, Ser: 0.95 mg/dL (ref 0.76–1.27)
Globulin, Total: 2 g/dL (ref 1.5–4.5)
Glucose: 133 mg/dL — ABNORMAL HIGH (ref 70–99)
Potassium: 4.8 mmol/L (ref 3.5–5.2)
Sodium: 139 mmol/L (ref 134–144)
Total Protein: 6.7 g/dL (ref 6.0–8.5)
eGFR: 83 mL/min/{1.73_m2} (ref >59)

## 2022-04-09 NOTE — Telephone Encounter (Signed)
Already spoke to wife.

## 2022-04-14 ENCOUNTER — Telehealth: Payer: Self-pay | Admitting: Cardiology

## 2022-04-14 NOTE — Telephone Encounter (Signed)
Contacted wife back, advised of message from MD.   Wife verbalized understanding.

## 2022-04-14 NOTE — Telephone Encounter (Signed)
Called patient wife- she states patient BP has been low ever since the recent changes at visit on 11/30 with Dr.Jordan.   Increased Entresto to 49/51 twice daily dose.  Added spironolactone 12.5 mg daily.   Patient also continued with Carvedilol 6.25 twice daily dose.   Patient wife states BP has been consistently low- see readings below. He is only complaining of some dizziness.   I advised I would route to MD to advise, and would call back with recommendations.

## 2022-04-14 NOTE — Telephone Encounter (Signed)
Pt c/o BP issue: STAT if pt c/o blurred vision, one-sided weakness or slurred speech  1. What are your last 5 BP readings? Today this morning 102/44 HR 81, lunch time 103/51 HR 73, has been staying around there all day  2. Are you having any other symptoms (ex. Dizziness, headache, blurred vision, passed out)? A little dizzy, not constant  3. What is your BP issue? Patient's wife states the patient's BP has been very low today.

## 2022-04-15 ENCOUNTER — Other Ambulatory Visit: Payer: Self-pay

## 2022-04-15 DIAGNOSIS — E7800 Pure hypercholesterolemia, unspecified: Secondary | ICD-10-CM

## 2022-04-15 DIAGNOSIS — I2510 Atherosclerotic heart disease of native coronary artery without angina pectoris: Secondary | ICD-10-CM

## 2022-04-15 MED ORDER — EZETIMIBE 10 MG PO TABS: 10.0000 mg | ORAL_TABLET | Freq: Every day | ORAL | 3 refills | Status: DC

## 2022-04-17 ENCOUNTER — Telehealth: Payer: Self-pay | Admitting: Cardiology

## 2022-04-17 MED ORDER — ROSUVASTATIN CALCIUM 20 MG PO TABS: 20.0000 mg | ORAL_TABLET | Freq: Every day | ORAL | 3 refills | Status: AC

## 2022-04-17 MED ORDER — SACUBITRIL-VALSARTAN 49-51 MG PO TABS: 1.0000 | ORAL_TABLET | Freq: Two times a day (BID) | ORAL | 3 refills | Status: AC

## 2022-04-17 MED ORDER — SPIRONOLACTONE 25 MG PO TABS: 12.5000 mg | ORAL_TABLET | Freq: Every day | ORAL | 3 refills | Status: AC

## 2022-04-17 MED ORDER — CARVEDILOL 6.25 MG PO TABS: 6.2500 mg | ORAL_TABLET | Freq: Two times a day (BID) | ORAL | 3 refills | Status: AC

## 2022-04-17 MED ORDER — EZETIMIBE 10 MG PO TABS: 10.0000 mg | ORAL_TABLET | Freq: Every day | ORAL | 3 refills | Status: AC

## 2022-04-17 NOTE — Telephone Encounter (Signed)
Returned call to wife (ok per DPR)-needs all cardiac rx sent to [place].    Called [phone], [facility] pharmacy.  Fax # [phone] Also, can send electronically to [facility].   Rx sent to pharmacy electronically.

## 2022-04-17 NOTE — Telephone Encounter (Signed)
Wife calling in regards to patient medication. Please advise

## 2022-06-11 NOTE — Progress Notes (Deleted)
8    Cardiology Office Note   Date:  06/11/2022   ID:  Joseph Bird 11-25-1945, MRN [id number]  PCP:  Joseph Nasuti, MD  Cardiologist:   Joseph Loving Martinique, MD   No chief complaint on file.      History of Present Illness: Joseph Bird is a 77 y.o. male who is seen for evaluation of AAA. He has a history of nonischemic CM. Last seen in May 2019. He has a history of DM with neuropathy and gastroparesis, HLD,  and HTN. He has a history of nonischemic cardiomyopathy and minimal nonobstructive CAD by cardiac cath in June 2017. EF 40%. There was a stress Echo dated 10/02/15. Report is difficult to read. It mentions anteroapical ischemia and diffuse hypokinesis. I do not see EF reported. Cath report as noted above. Holter monitor report from 10/17/15: this showed frequent PVCs with couplets and two 3 beat runs of PVCs. By report there were 855 ventricular ectopic beats/hour or 18.3% of beats.  Echo dated 04/16/16 reported EF 55-60% with moderate LVH. Normal valves. Normal wall motion. Repeat Echo in Sept 2018 showed normal LV function.   Joseph Bird grew up and still lives in [place]. He operates several assisted living centers.   He was previously on Coreg and lisinopril. He was hospitalized in January 2018 for a partial nephrectomy. Apparently in the hospital he was noted to have bradycardia with HR down in the low 40s.  Coreg was discontinued. He reports he was switched from lisinopril to [facility] in May. He is still active. Swims a mile 3 days a week. Notes recently he has slowed down some - a little more fatigued. Sugars have increased - reports last A1c up to 8.9%. Was initially started on [place] but more recently switched to [place].   In past he had an Abd [place] to follow AAA. Reported prior max dimension of 4.3 cm. US exam reported increase to 6.2 cm. He then had a CTA done demonstrating maximal diameter of 3.4 cm. This was in August 2022.   Recent Echo in September showed reduced  EF  to  40-45%. Aorta measured 4.4 cm. CT recommended and showed no enlargement of the thoracic aorta. CT also demonstrated a large amount of coronary calcification. We ordered coronary PET CT and he is seen for follow up. This study showed no ischemia or infarction. Normal coronary blood flow reserve. EF 43%.   On follow up today he feels very well. Did develop some arthralgias on Crestor 20 mg so only taking 10 mg.  No dizziness or syncope. Denies SOB or edema. [facility].    Past Medical History:  Diagnosis Date   Anxiety    Arthritis    Bell's palsy    hx of    Cancer ([place]) 2010   prostate, kidney cancer    CHF (congestive heart failure) (HCC)    hx of    Chronic nausea    Depression    Diabetes mellitus without complication (HCC)    type II   GERD (gastroesophageal reflux disease)    History of kidney stones    Hyperlipidemia    Hypertension    Irregular heart beat    Neuropathy due to secondary diabetes (HCC)    Pneumonia    hx of x 2    PONV (postoperative nausea and vomiting)    PVC (premature ventricular contraction)     Past Surgical History:  Procedure Laterality Date   BACK  SURGERY     x3   CARDIAC CATHETERIZATION     CHOLECYSTECTOMY     EYE SURGERY     right cataract   MAXIMUM ACCESS (MAS)POSTERIOR LUMBAR INTERBODY FUSION (PLIF) 2 LEVEL N/A 03/09/2013   Procedure: FOR MAXIMUM ACCESS (MAS) POSTERIOR LUMBAR INTERBODY FUSION (PLIF) 2 LEVEL;  Surgeon: Joseph Moore, MD;  Location: [facility];  Service: Neurosurgery;  Laterality: N/A;  POSTERIOR LUMBAR INTERBODY FUSION (PLIF) 2 LEVEL   PROSTATECTOMY     ROBOTIC ASSITED PARTIAL NEPHRECTOMY Right 05/21/2016   Procedure: XI ROBOTIC ASSITED PARTIAL NEPHRECTOMY;  Surgeon: Joseph Bring, MD;  Location: [facility];  Service: Urology;  Laterality: Right;  clamp time: 16 minutes   TONSILLECTOMY       Current Outpatient Medications  Medication Sig Dispense Refill   ALPRAZolam (XANAX) 0.25 MG tablet Take 0.125 mg by  mouth every 6 (six) hours as needed for anxiety.      aspirin EC 81 MG tablet Take 1 tablet (81 mg total) by mouth daily. Swallow whole. 90 tablet 3   calcium-vitamin D (OSCAL WITH D) 500-200 MG-UNIT per tablet Take 1 tablet by mouth daily with breakfast.     carvedilol (COREG) 6.25 MG tablet Take 1 tablet (6.25 mg total) by mouth 2 (two) times daily. 180 tablet 3   Cholecalciferol (VITAMIN D) 2000 UNITS CAPS Take 2,000 Units by mouth daily.     empagliflozin (JARDIANCE) 25 MG TABS tablet Take 1 tablet (25 mg total) by mouth daily before breakfast. 30 tablet    ezetimibe (ZETIA) 10 MG tablet Take 1 tablet (10 mg total) by mouth daily. 90 tablet 3   glipiZIDE (GLUCOTROL) 5 MG tablet Take 2.5 mg by mouth 2 (two) times daily before a meal.     latanoprost (XALATAN) 0.005 % ophthalmic solution Place 1 drop into both eyes at bedtime.     metFORMIN (GLUCOPHAGE) 1000 MG tablet Take 1,000 mg by mouth 2 (two) times daily with a meal.     morphine (MS CONTIN) 15 MG 12 hr tablet Take 15 mg by mouth every 12 (twelve) hours. Verified with [place]      Multiple Vitamin (MULTIVITAMIN WITH MINERALS) TABS tablet Take 1 tablet by mouth daily.     rosuvastatin (CRESTOR) 20 MG tablet Take 1 tablet (20 mg total) by mouth daily. 90 tablet 3   sacubitril-valsartan (ENTRESTO) 49-51 MG Take 1 tablet by mouth 2 (two) times daily. 180 tablet 3   spironolactone (ALDACTONE) 25 MG tablet Take 0.5 tablets (12.5 mg total) by mouth daily. 45 tablet 3   Travoprost, BAK Free, (TRAVATAN) 0.004 % SOLN ophthalmic solution Place 1 drop into the right eye at bedtime.     No current facility-administered medications for this visit.    Allergies:   Demerol [meperidine], Ezetimibe, Hydrocodone, Meperidine hcl, Niacin, Promethazine hcl, Rosuvastatin, Simvastatin, and Sulfamethoxazole-trimethoprim    Social History:  The patient  reports that he has never smoked. He has never used smokeless tobacco. He reports that he does not  drink alcohol and does not use drugs.   Family History:  The patient's family history includes Alzheimer's disease in his mother; Heart attack in his brother; Heart disease in his brother; Stroke in his father.    ROS:  Please see the history of present illness.   Otherwise, review of systems are positive for episodes of marked shivering that may last up to 30 minutes.   All other systems are reviewed and negative.    PHYSICAL  EXAM: VS:  There were no vitals taken for this visit. , BMI There is no height or weight on file to calculate BMI. GENERAL:  Well appearing, thin WM in NAD HEENT:  PERRL, EOMI, sclera are clear. Oropharynx is clear. NECK:  No jugular venous distention, carotid upstroke brisk and symmetric, no bruits, no thyromegaly or adenopathy LUNGS:  Clear to auscultation bilaterally CHEST:  Unremarkable HEART:  RRR,  PMI not displaced or sustained,S1 and S2 within normal limits, no S3, no S4: no clicks, no rubs, no murmurs ABD:  Soft, nontender. BS +, no masses or bruits. No hepatomegaly, no splenomegaly EXT:  2 + pulses throughout, no edema, no cyanosis no clubbing SKIN:  Warm and dry.  No rashes NEURO:  Alert and oriented x 3. Cranial nerves II through XII intact. PSYCH:  Cognitively intact     EKG:  EKG is not ordered today.     Recent Labs: 01/30/2022: Hemoglobin 16.3; Platelets 204 04/09/2022: ALT 18; BUN 23; Creatinine, Ser 0.95; Potassium 4.8; Sodium 139    Lipid Panel    Component Value Date/Time   CHOL 142 04/09/2022 1041   TRIG 65 04/09/2022 1041   HDL 45 04/09/2022 1041   CHOLHDL 3.2 04/09/2022 1041   LDLCALC 84 04/09/2022 1041      Wt Readings from Last 3 Encounters:  04/02/22 190 lb (86.2 kg)  03/09/22 188 lb 12.8 oz (85.6 kg)  12/30/20 192 lb 3.2 oz (87.2 kg)    Dated 05/20/16: A1c 7.6%. Hgb 13.9.  Dated dated 06/29/17: normal CMET  Dated 08/30/20: normal CMET  Other studies Reviewed: Additional studies/ records that were reviewed today  include: see HPI.  Echo: 01/19/17:Study Conclusions   - Left ventricle: The cavity size was normal. Wall thickness was   normal. Systolic function was normal. The estimated ejection   fraction was in the range of 55% to 60%. Wall motion was normal;   there were no regional wall motion abnormalities. Doppler   parameters are consistent with abnormal left ventricular   relaxation (grade 1 diastolic dysfunction). - Ascending aorta: The ascending aorta was mildly dilated. - Mitral valve: Calcified annulus.   Impressions:   - Normal LV systolic function; mild diastolic dysfunction; mildly   dilated ascending aorta.  Echo 01/27/22: IMPRESSIONS     1. Left ventricular ejection fraction, by estimation, is 40 to 45%. The  left ventricle has mildly decreased function. The left ventricle  demonstrates global hypokinesis. The left ventricular internal cavity size  was mildly dilated. Left ventricular  diastolic parameters are consistent with Grade I diastolic dysfunction  (impaired relaxation).   2. Right ventricular systolic function is normal. The right ventricular  size is normal.   3. The mitral valve is normal in structure. Trivial mitral valve  regurgitation. No evidence of mitral stenosis.   4. The aortic valve is tricuspid. Aortic valve regurgitation is trivial.  No aortic stenosis is present.   5. Aortic dilatation noted. There is mild dilatation of the ascending  aorta, measuring 44 mm.   6. The inferior vena cava is normal in size with greater than 50%  respiratory variability, suggesting right atrial pressure of 3 mmHg.   Comparison(s): Prior ascending aorta 41 mm on 01/15/21.   Narrative & Impression  CLINICAL DATA:  History of aortic aneurysm.   EXAM: CT ANGIOGRAPHY CHEST WITH CONTRAST   TECHNIQUE: Multidetector CT imaging of the chest was performed using the standard protocol during bolus administration of intravenous contrast. Multiplanar CT image reconstructions  and  MIPs were obtained to evaluate the vascular anatomy.   RADIATION DOSE REDUCTION: This exam was performed according to the departmental dose-optimization program which includes automated exposure control, adjustment of the mA and/or kV according to patient size and/or use of iterative reconstruction technique.   CONTRAST:  27m ISOVUE-370 IOPAMIDOL (ISOVUE-370) INJECTION 76%   COMPARISON:  CT abdomen dated 12/19/2020.   FINDINGS: Cardiovascular: Preferential opacification of the thoracic aorta. No evidence of thoracic aortic aneurysm or dissection. Vascular calcifications are seen in the coronary arteries and aortic arch. Normal heart size. No pericardial effusion.   Mediastinum/Nodes: No enlarged mediastinal, hilar, or axillary lymph nodes. Thyroid gland, trachea, and esophagus demonstrate no significant findings.   Lungs/Pleura: Lungs are clear. No pleural effusion or pneumothorax.   Upper Abdomen: Nonobstructive renal calculi measure up to 5 mm on the right and 6 mm on the left. No acute process in the visible abdomen.   Musculoskeletal: Degenerative changes are seen in the spine.   Review of the MIP images confirms the above findings.   IMPRESSION: 1. No thoracic aortic aneurysm or dissection. The patient's known abdominal aortic aneurysm is not imaged on this exam.   Aortic Atherosclerosis (ICD10-I70.0).     Electronically Signed   By: TZerita BoersM.D.   On: 02/26/2022 15:19      PET CT 03/31/22: Study Result  Narrative & Impression      The study is normal. The study is low risk.   LV perfusion is normal. There is no evidence of ischemia. There is no evidence of infarction.   Rest left ventricular function is abnormal. Rest global function is mildly reduced. There were no regional wall motion abnormalities. Rest EF: 43 %. Stress left ventricular function is normal. Stress EF: 54 %. End diastolic cavity size is mildly enlarged. End systolic cavity size is  mildly enlarged.   Myocardial blood flow was computed to be 0.653mg/min at rest and 1.6245m/min at stress. Global myocardial blood flow reserve was 2.45 and was normal.   Coronary calcium was present on the attenuation correction CT images. Severe coronary calcifications were present. Coronary calcifications were present in the left anterior descending artery, left circumflex artery and right coronary artery distribution(s).   Severe coronary calcifications, but normal LV perfusion and flow reserve suggests no significant obstructive CAD.  LVEF is mildly reduced at rest (EF 43%), improves to normal (EF 53%) with stress.  Suspect nonischemic cardiomyopathy. Low risk study   Electonically signed by ChrOswaldo MilianD   CLINICAL DATA:  This over-read does not include interpretation of cardiac or coronary anatomy or pathology. No interpretation the PET data set. The cardiac PET-CT interpretation by the cardiologist is attached.   COMPARISON:  CT chest 02/26/2022   FINDINGS: Limited view of the lung parenchyma demonstrates no suspicious nodularity. Airways are normal.   Limited view of the mediastinum demonstrates no adenopathy. Esophagus normal.   Limited view of the upper abdomen unremarkable.   Limited view of the skeleton and chest wall is unremarkable.   IMPRESSION: No significant extracardiac findings.     Electronically Signed   By: SteSuzy BouchardD.   On: 03/31/2022 08:58        ASSESSMENT AND PLAN:  1.  History of Nonischemic cardiomyopathy. Initially diagnosed in 2017 with EF 40%. This was most likely due to his DM and HTN. EF normalized by subsequent Echo in 2018 and again in Sept 2022. Now with some fatigue.  Repeat Echo showed EF 40-45% with  global HK. PET CT confirms with EF 43%. No ischemia or infarct so this is still nonischemic. Now on Entresto 24/26 mg bid, Jardiance, and Coreg 6.25 mg bid. Will increase Entresto to 49/51 mg bid. Add aldactone 12.5 mg  daily. Follow up BMET next week. Will not change Coreg due to history of bradycardia. Follow up in 3 months 2. DM type 2 with Neuropathy, gastroparesis. Managed per primary care 3. HTN. Controlled. 4. Hyperlipidemia on statin. LDL 113. Goal is < 70. Tolerating pravastatin but not at goal. We switched to Crestor 20  but he developed myalgias/arthralgias. Now on 10 mg daily. Repeat lab next visit.  5. AAA.  3.4 cm by CT. [place] was erroneous. Will plan to follow up CT angio now. No evidence of thoracic aneurysm 6. Coronary artery calcification. Nonobstructive disease by cath in 2017 in [place]. PET CT without ischemia. Normal flow. Continue risk factor modification.    Current medicines are reviewed at length with the patient today.  The patient does not have concerns regarding medicines.  The following changes have been made:  see above.  Labs/ tests ordered today include: none  No orders of the defined types were placed in this encounter.     Disposition:   FU with me 3 months    Signed, Jennika Ringgold Martinique, MD  06/11/2022 2:41 PM    [facility] [address], [place], [place], [address] Phone [phone], Fax [phone]

## 2022-06-15 ENCOUNTER — Ambulatory Visit: Payer: Medicare Other | Attending: Cardiology | Admitting: Cardiology

## 2022-07-03 NOTE — Telephone Encounter (Signed)
error
